# Patient Record
Sex: Female | Born: 1935 | Race: White | Hispanic: No | Smoking: Never smoker
Health system: Southern US, Community
[De-identification: ages and names within clinical notes are randomized; demographics above are authoritative.]

## PROBLEM LIST (undated history)

## (undated) DIAGNOSIS — J189 Pneumonia, unspecified organism: Secondary | ICD-10-CM

## (undated) DIAGNOSIS — G459 Transient cerebral ischemic attack, unspecified: Secondary | ICD-10-CM

## (undated) DIAGNOSIS — H9190 Unspecified hearing loss, unspecified ear: Secondary | ICD-10-CM

## (undated) DIAGNOSIS — S7290XA Unspecified fracture of unspecified femur, initial encounter for closed fracture: Secondary | ICD-10-CM

## (undated) DIAGNOSIS — C801 Malignant (primary) neoplasm, unspecified: Secondary | ICD-10-CM

## (undated) DIAGNOSIS — I10 Essential (primary) hypertension: Secondary | ICD-10-CM

## (undated) DIAGNOSIS — S329XXA Fracture of unspecified parts of lumbosacral spine and pelvis, initial encounter for closed fracture: Secondary | ICD-10-CM

## (undated) DIAGNOSIS — I639 Cerebral infarction, unspecified: Secondary | ICD-10-CM

## (undated) DIAGNOSIS — K219 Gastro-esophageal reflux disease without esophagitis: Secondary | ICD-10-CM

## (undated) DIAGNOSIS — Z974 Presence of external hearing-aid: Secondary | ICD-10-CM

## (undated) DIAGNOSIS — D649 Anemia, unspecified: Secondary | ICD-10-CM

## (undated) DIAGNOSIS — Z972 Presence of dental prosthetic device (complete) (partial): Secondary | ICD-10-CM

## (undated) DIAGNOSIS — H409 Unspecified glaucoma: Secondary | ICD-10-CM

## (undated) DIAGNOSIS — E039 Hypothyroidism, unspecified: Secondary | ICD-10-CM

## (undated) DIAGNOSIS — I7300 Raynaud's syndrome without gangrene: Secondary | ICD-10-CM

## (undated) DIAGNOSIS — R519 Headache, unspecified: Secondary | ICD-10-CM

## (undated) DIAGNOSIS — I719 Aortic aneurysm of unspecified site, without rupture: Secondary | ICD-10-CM

## (undated) DIAGNOSIS — R51 Headache: Secondary | ICD-10-CM

## (undated) DIAGNOSIS — Z973 Presence of spectacles and contact lenses: Secondary | ICD-10-CM

## (undated) HISTORY — PX: CATARACT EXTRACTION W/ INTRAOCULAR LENS  IMPLANT, BILATERAL: SHX1307

## (undated) HISTORY — PX: OTHER SURGICAL HISTORY: SHX169

## (undated) HISTORY — PX: MASTECTOMY: SHX3

---

## 1998-01-27 ENCOUNTER — Encounter: Payer: Self-pay | Admitting: Emergency Medicine

## 1998-01-27 ENCOUNTER — Ambulatory Visit (HOSPITAL_COMMUNITY): Admission: RE | Admit: 1998-01-27 | Discharge: 1998-01-27 | Payer: Self-pay | Admitting: Emergency Medicine

## 1999-09-03 ENCOUNTER — Encounter: Payer: Self-pay | Admitting: Emergency Medicine

## 1999-09-03 ENCOUNTER — Encounter: Admission: RE | Admit: 1999-09-03 | Discharge: 1999-09-03 | Payer: Self-pay | Admitting: Emergency Medicine

## 1999-11-27 ENCOUNTER — Other Ambulatory Visit: Admission: RE | Admit: 1999-11-27 | Discharge: 1999-11-27 | Payer: Self-pay | Admitting: Emergency Medicine

## 1999-11-27 ENCOUNTER — Encounter: Payer: Self-pay | Admitting: Emergency Medicine

## 1999-11-27 ENCOUNTER — Encounter: Admission: RE | Admit: 1999-11-27 | Discharge: 1999-11-27 | Payer: Self-pay | Admitting: Emergency Medicine

## 2000-09-04 ENCOUNTER — Encounter: Payer: Self-pay | Admitting: Emergency Medicine

## 2000-09-04 ENCOUNTER — Encounter: Admission: RE | Admit: 2000-09-04 | Discharge: 2000-09-04 | Payer: Self-pay | Admitting: Emergency Medicine

## 2001-05-13 ENCOUNTER — Encounter: Payer: Self-pay | Admitting: Emergency Medicine

## 2001-05-13 ENCOUNTER — Encounter: Admission: RE | Admit: 2001-05-13 | Discharge: 2001-05-13 | Payer: Self-pay | Admitting: Emergency Medicine

## 2001-06-15 ENCOUNTER — Encounter: Payer: Self-pay | Admitting: Emergency Medicine

## 2001-06-15 ENCOUNTER — Encounter: Admission: RE | Admit: 2001-06-15 | Discharge: 2001-06-15 | Payer: Self-pay | Admitting: Emergency Medicine

## 2001-06-18 ENCOUNTER — Encounter: Admission: RE | Admit: 2001-06-18 | Discharge: 2001-06-18 | Payer: Self-pay | Admitting: Emergency Medicine

## 2001-06-18 ENCOUNTER — Encounter: Payer: Self-pay | Admitting: Emergency Medicine

## 2001-08-18 ENCOUNTER — Encounter: Admission: RE | Admit: 2001-08-18 | Discharge: 2001-08-18 | Payer: Self-pay | Admitting: Emergency Medicine

## 2001-08-18 ENCOUNTER — Encounter: Payer: Self-pay | Admitting: Emergency Medicine

## 2002-01-14 ENCOUNTER — Ambulatory Visit (HOSPITAL_COMMUNITY): Admission: RE | Admit: 2002-01-14 | Discharge: 2002-01-14 | Payer: Self-pay | Admitting: Gastroenterology

## 2002-08-17 ENCOUNTER — Encounter: Payer: Self-pay | Admitting: Emergency Medicine

## 2002-08-17 ENCOUNTER — Encounter: Admission: RE | Admit: 2002-08-17 | Discharge: 2002-08-17 | Payer: Self-pay | Admitting: Emergency Medicine

## 2016-02-12 HISTORY — PX: MASTECTOMY: SHX3

## 2018-05-05 ENCOUNTER — Emergency Department (HOSPITAL_COMMUNITY): Payer: Medicare Other

## 2018-05-05 ENCOUNTER — Encounter (HOSPITAL_COMMUNITY): Payer: Self-pay | Admitting: Emergency Medicine

## 2018-05-05 ENCOUNTER — Inpatient Hospital Stay (HOSPITAL_COMMUNITY)
Admission: EM | Admit: 2018-05-05 | Discharge: 2018-05-08 | DRG: 481 | Disposition: A | Discharge status: Skilled Nursing Facility | Payer: Medicare Other | Attending: Internal Medicine | Admitting: Internal Medicine

## 2018-05-05 DIAGNOSIS — Z419 Encounter for procedure for purposes other than remedying health state, unspecified: Secondary | ICD-10-CM

## 2018-05-05 DIAGNOSIS — R269 Unspecified abnormalities of gait and mobility: Secondary | ICD-10-CM | POA: Diagnosis present

## 2018-05-05 DIAGNOSIS — Z6820 Body mass index (BMI) 20.0-20.9, adult: Secondary | ICD-10-CM

## 2018-05-05 DIAGNOSIS — S72332A Displaced oblique fracture of shaft of left femur, initial encounter for closed fracture: Secondary | ICD-10-CM

## 2018-05-05 DIAGNOSIS — W1830XA Fall on same level, unspecified, initial encounter: Secondary | ICD-10-CM | POA: Diagnosis present

## 2018-05-05 DIAGNOSIS — Z7901 Long term (current) use of anticoagulants: Secondary | ICD-10-CM

## 2018-05-05 DIAGNOSIS — I129 Hypertensive chronic kidney disease with stage 1 through stage 4 chronic kidney disease, or unspecified chronic kidney disease: Secondary | ICD-10-CM | POA: Diagnosis present

## 2018-05-05 DIAGNOSIS — I7121 Aneurysm of the ascending aorta, without rupture: Secondary | ICD-10-CM

## 2018-05-05 DIAGNOSIS — D62 Acute posthemorrhagic anemia: Secondary | ICD-10-CM | POA: Diagnosis not present

## 2018-05-05 DIAGNOSIS — Y9301 Activity, walking, marching and hiking: Secondary | ICD-10-CM | POA: Diagnosis present

## 2018-05-05 DIAGNOSIS — S72333A Displaced oblique fracture of shaft of unspecified femur, initial encounter for closed fracture: Secondary | ICD-10-CM | POA: Diagnosis present

## 2018-05-05 DIAGNOSIS — Z8249 Family history of ischemic heart disease and other diseases of the circulatory system: Secondary | ICD-10-CM

## 2018-05-05 DIAGNOSIS — W19XXXA Unspecified fall, initial encounter: Secondary | ICD-10-CM

## 2018-05-05 DIAGNOSIS — M25562 Pain in left knee: Secondary | ICD-10-CM | POA: Diagnosis not present

## 2018-05-05 DIAGNOSIS — N183 Chronic kidney disease, stage 3 unspecified: Secondary | ICD-10-CM

## 2018-05-05 DIAGNOSIS — Z823 Family history of stroke: Secondary | ICD-10-CM

## 2018-05-05 DIAGNOSIS — Z8673 Personal history of transient ischemic attack (TIA), and cerebral infarction without residual deficits: Secondary | ICD-10-CM

## 2018-05-05 DIAGNOSIS — T1490XA Injury, unspecified, initial encounter: Secondary | ICD-10-CM

## 2018-05-05 DIAGNOSIS — I712 Thoracic aortic aneurysm, without rupture: Secondary | ICD-10-CM

## 2018-05-05 DIAGNOSIS — I10 Essential (primary) hypertension: Secondary | ICD-10-CM

## 2018-05-05 DIAGNOSIS — E785 Hyperlipidemia, unspecified: Secondary | ICD-10-CM | POA: Diagnosis present

## 2018-05-05 HISTORY — DX: Essential (primary) hypertension: I10

## 2018-05-05 HISTORY — DX: Cerebral infarction, unspecified: I63.9

## 2018-05-05 HISTORY — DX: Raynaud's syndrome without gangrene: I73.00

## 2018-05-05 HISTORY — DX: Malignant (primary) neoplasm, unspecified: C80.1

## 2018-05-05 LAB — URINALYSIS, ROUTINE W REFLEX MICROSCOPIC
Bilirubin Urine: NEGATIVE
Glucose, UA: NEGATIVE mg/dL
Hgb urine dipstick: NEGATIVE
Ketones, ur: NEGATIVE mg/dL
Leukocytes,Ua: NEGATIVE
Nitrite: NEGATIVE
PH: 7 (ref 5.0–8.0)
Protein, ur: NEGATIVE mg/dL
Specific Gravity, Urine: 1.012 (ref 1.005–1.030)

## 2018-05-05 NOTE — ED Triage Notes (Signed)
Per EMS pt was found on the living room floor on her left side. Pt reports she fell. EMS reports seeing a folded rug corner near pt. EMS reports pt left leg was rotated out. Per EMS, Pt reported pain of 10 which decreased to 2 after leg was splinted. Pt refused pains meds. Pt was also started NaCl KVO en route. Pt denies LOC. Pt is on Xarelta, propanolol, and is allergic to penicillin. PT has hx of stroke, HTN, reynards, and a right mystectomy.

## 2018-05-05 NOTE — ED Provider Notes (Signed)
Medical screening examination/treatment/procedure(s) were conducted as a shared visit with non-physician practitioner(s) and myself.  I personally evaluated the patient during the encounter. Briefly, the patient is a 85 y.o. female is a 85 year old female who presents to the ED with left leg pain after being found on the floor for an extended period of time.  Patient with normal vitals.  No fever.  Patient found by neighbor on the floor.  She states that she fell landed on her left leg and was unable to get up.  EMS states that it appeared that she tripped over the corner of the rug.  She denies any chest pain, shortness of breath.  Did not have any dizzy this or syncope prior to the event.  She has good pulses throughout on exam.  She does have rotation of the left lower extremity.  Patient is on Xarelto.  Patient with overall normal neurological exam.  She is overall well-appearing.  Suspect leg fracture.  CT scan of the head and neck are unremarkable.  X-ray showed left femur fracture.  Lab work is pending.  EKG unremarkable.  Once lab work is back she will be admitted to hospitalist and will touch base with orthopedics about femur fracture that will likely need repair.  Patient does live by herself and is fully functional.  Hemodynamically stable throughout my care. Likely mechanical fall.  This chart was dictated using voice recognition software.  Despite best efforts to proofread,  errors can occur which can change the documentation meaning.     EKG Interpretation  Date/Time:  Tuesday May 05 2018 23:44:30 EDT Ventricular Rate:  64 PR Interval:    QRS Duration: 113 QT Interval:  434 QTC Calculation: 430 R Axis:   71 Text Interpretation:  Sinus rhythm Ventricular premature complex Short PR interval Biatrial enlargement Confirmed by Lennice Sites [phone]) on 05/05/2018 11:47:44 PM           Lennice Sites, DO 05/05/18 2350

## 2018-05-05 NOTE — ED Provider Notes (Addendum)
[facility] EMERGENCY DEPARTMENT Provider Note   CSN: [id number] Arrival date & time: 05/05/18  2134    History   Chief Complaint Chief Complaint  Patient presents with  . Leg Injury    HPI Laura Cabrera is a 85 y.o. female with PMHx previous stroke on Xarelto and HTN who presents to the ED via EMS for fall/left hip pain. Per nursing staff, EMS was called after neighbors noticed pt's garage door was opened for quite some time. They went and checked on her and found her laying on the floor; unknown down time. EMS reported pts leg was externally rotated and a splint was placed which decreased pts pain from a 10/10 to a 2/10. She refused pain medicine en route. Pt cannot recall how she fell but EMS reported a folded rug corner near pt. Pt reports she fell "when the sun was still up" and laid on her hardwood floor for quite some time. She is currently only complaining of left hip and left knee pain. No other complaints at this time.      Past Medical History:  Diagnosis Date  . Cancer [facility])    right breast cancer  . Hypertension   . Raynaud disease   . Stroke [facility])     Patient Active Problem List   Diagnosis Date Noted  . Closed displaced oblique fracture of shaft of femur ([place]) 05/06/2018  . Hypertension 05/06/2018  . History of stroke 05/06/2018  . Thoracic ascending aortic aneurysm ([place]) 05/06/2018  . CKD (chronic kidney disease), stage III ([place]) 05/06/2018    Past Surgical History:  Procedure Laterality Date  . INTRAMEDULLARY (IM) NAIL INTERTROCHANTERIC N/A 05/06/2018   Procedure: INTRAMEDULLARY (IM) NAIL INTERTROCHANTRIC;  Surgeon: Shona Needles, MD;  Location: [facility];  Service: Orthopedics;  Laterality: N/A;  . MASTECTOMY    . MASTECTOMY Right 2018     OB History   No obstetric history on file.      Home Medications    Prior to Admission medications   Medication Sig Start Date End Date Taking? Authorizing Provider  clopidogrel (PLAVIX) 75  MG tablet Take 75 mg by mouth daily. 04/20/18  Yes [provider]  latanoprost (XALATAN) 0.005 % ophthalmic solution Place 1 drop into both eyes at bedtime. 03/13/18  Yes [provider]  propranolol (INDERAL) 40 MG tablet Take 40 mg by mouth daily. 12/10/17  Yes [provider]  oxyCODONE (OXY IR/ROXICODONE) 5 MG immediate release tablet Take 1 tablet (5 mg total) by mouth every 6 (six) hours as needed for moderate pain or breakthrough pain. 05/08/18   Arrien, Jimmy Picket, MD    Family History Family History  Problem Relation Age of Onset  . Stroke Mother   . Hypertension Father   . Coronary artery disease Father     Social History Social History   Tobacco Use  . Smoking status: Never Smoker  . Smokeless tobacco: Never Used  Substance Use Topics  . Alcohol use: Never    Frequency: Never  . Drug use: Never     Allergies   Penicillins; Sulfamethoxazole; and Penicillin g   Review of Systems Review of Systems  Constitutional: Negative for fever.  HENT: Negative for congestion.   Eyes: Negative for redness.  Respiratory: Negative for cough.   Cardiovascular: Negative for chest pain.  Gastrointestinal: Negative for abdominal pain.  Musculoskeletal: Positive for arthralgias and joint swelling. Negative for neck pain.  Skin: Negative for wound.  Neurological: Negative for  headaches.  Hematological: Bruises/bleeds easily.     Physical Exam Updated Vital Signs BP (!) 156/116   Pulse (!) 54   Temp 98.3 F (36.8 C)   Resp 20   SpO2 99%   Physical Exam Vitals signs and nursing note reviewed.  Constitutional:      Appearance: Normal appearance. She is not ill-appearing.  HENT:     Head: Normocephalic and atraumatic.  Eyes:     Conjunctiva/sclera: Conjunctivae normal.  Neck:     Musculoskeletal: Neck supple.  Cardiovascular:     Rate and Rhythm: Normal rate and regular rhythm.     Pulses: Normal pulses.     Heart sounds: No murmur.   Pulmonary:     Effort: Pulmonary effort is normal.     Breath sounds: Normal breath sounds. No wheezing, rhonchi or rales.  Abdominal:     Palpations: Abdomen is soft.     Tenderness: There is no abdominal tenderness.  Musculoskeletal:     Comments: Left leg splinted; mild swelling to anterior thigh; tenderness to palpation of left thigh anteriorly and left knee. DP pulse 2+ on left. Sensation intact. No TTP of left ankle. Able to wiggle toes.   No C, T, or L midline spine TTP.   Skin:    General: Skin is warm and dry.  Neurological:     Mental Status: She is alert.      ED Treatments / Results  Labs (all labs ordered are listed, but only abnormal results are displayed) Labs Reviewed  CBC WITH DIFFERENTIAL/PLATELET - Abnormal; Notable for the following components:      Result Value   WBC 12.2 (*)    RBC 3.20 (*)    Hemoglobin 9.9 (*)    HCT 31.9 (*)    Platelets 130 (*)    Neutro Abs 10.4 (*)    Abs Immature Granulocytes 0.08 (*)    All other components within normal limits  URINALYSIS, ROUTINE W REFLEX MICROSCOPIC - Abnormal; Notable for the following components:   Color, Urine STRAW (*)    All other components within normal limits  COMPREHENSIVE METABOLIC PANEL - Abnormal; Notable for the following components:   Glucose, Bld 139 (*)    Creatinine, Ser 1.22 (*)    Total Protein 6.3 (*)    Albumin 3.3 (*)    GFR calc non Af Amer 39 (*)    GFR calc Af Amer 45 (*)    All other components within normal limits  BASIC METABOLIC PANEL - Abnormal; Notable for the following components:   Glucose, Bld 159 (*)    Creatinine, Ser 1.20 (*)    Calcium 8.8 (*)    GFR calc non Af Amer 39 (*)    GFR calc Af Amer 46 (*)    All other components within normal limits  CBC - Abnormal; Notable for the following components:   RBC 2.98 (*)    Hemoglobin 9.6 (*)    HCT 29.1 (*)    Platelets 117 (*)    All other components within normal limits  CBC WITH DIFFERENTIAL/PLATELET -  Abnormal; Notable for the following components:   RBC 2.52 (*)    Hemoglobin 7.7 (*)    HCT 24.2 (*)    Platelets 101 (*)    All other components within normal limits  BASIC METABOLIC PANEL - Abnormal; Notable for the following components:   Sodium 133 (*)    Glucose, Bld 149 (*)    Creatinine, Ser 1.22 (*)  Calcium 8.3 (*)    GFR calc non Af Amer 39 (*)    GFR calc Af Amer 45 (*)    All other components within normal limits  CBC - Abnormal; Notable for the following components:   RBC 2.20 (*)    Hemoglobin 7.0 (*)    HCT 21.5 (*)    Platelets 96 (*)    All other components within normal limits  MRSA PCR SCREENING  SURGICAL PCR SCREEN  CK  TYPE AND SCREEN  ABO/RH    EKG EKG Interpretation  Date/Time:  Tuesday May 05 2018 23:44:30 EDT Ventricular Rate:  64 PR Interval:    QRS Duration: 113 QT Interval:  434 QTC Calculation: 430 R Axis:   71 Text Interpretation:  Sinus rhythm Ventricular premature complex Short PR interval Biatrial enlargement Confirmed by Lennice Sites [phone]) on 05/05/2018 11:47:44 PM Also confirmed by Lennice Sites [phone]), editor Philomena Doheny [phone])  on 05/06/2018 7:14:55 AM   Radiology No results found.  Procedures Procedures (including critical care time)  Medications Ordered in ED Medications  ceFAZolin (ANCEF) 2-4 GM/100ML-% IVPB (has no administration in time range)  fentaNYL (SUBLIMAZE) injection 50 mcg (50 mcg Intravenous Given 05/06/18 0034)  ceFAZolin (ANCEF) IVPB 2g/100 mL premix (2 g Intravenous New Bag/Given 05/07/18 0534)     Initial Impression / Assessment and Plan / ED Course  I have reviewed the triage vital signs and the nursing notes.  Pertinent labs & imaging results that were available during my care of the patient were reviewed by me and considered in my medical decision making (see chart for details).  Clinical Course as of May 07 2325  Tue May 05, 2018  2200 Patient seen by myself with orienting PA Eustaquio Maize.  Patient with fall, down for unknown amount of time and found by neighbor.  Left lower extremity splinted on arrival, patient found to have internal rotation and shortening with tenderness over the left hip and proximal femur concerning for possible hip fracture.  No other obvious injuries on exam.  Will check basic labs, CK, urinalysis.  Pelvis and femur films, chest x-ray and CT head and C-spine.  Patient is currently on Xarelto.  X-ray shows proximal femur fracture, imaging is otherwise unremarkable.  Patient is neurovascularly intact blood pressure remains stable and does not suggest significant blood loss from femur fracture.  Case discussed with Dr. Marcelino Scot with orthopedics who recommends Buck's traction and will see and evaluate the patient.  Case discussed with Dr. Posey Pronto with [facility] hospitalist who will see and admit the patient.   [KF]    Clinical Course User Index [KF] Jacqlyn Larsen, PA-C   Pt presents for ground level fall; unknown cause and unknown down time. Currently complaining of left leg pain; splinted en route. EMS reported leg was externally rotated. Pt is on Xarelto and unable to say if she hit her head. Will get x rays of left hip, femur, and knee. CT Head ordered as well; pt unable to say if she hit her head. EKG ordered as pt unable to state what caused her to fall. CK ordered to rule out rhabdo given unknown down time. Baseline labs ordered including CBC, CMP, U/A.   12:15 AM R femur x ray shows displaced angulated proximal femur fracture. No other findings on CT Head, CT C spine, or remainder of x rays. U/A negative. EKG negative. Mildly elevated creatinine 1.22; unknown baseline. Benedetto Goad, PA-C discussed case with ortho Dr. Marcelino Scot who is in  agreement to see pt in the morning. Order placed to have pt put into [facility] traction. Will consult hospitalist once remainder of labs return. 50 mg Fentanyl ordered for pt prior to traction placed.   12:40 AM CBC with mildly  elevated leukocytosis of 12.2; likely increased due to pain. H&H stable at this time; do not have a previous value to compare it to. CK not elevated; no concern for rhabdo at this time. Benedetto Goad, PA-C discussed case with hospitalist Dr. Posey Pronto who is in agreement to admit patient.        Final Clinical Impressions(s) / ED Diagnoses   Final diagnoses:  Closed displaced oblique fracture of shaft of left femur, initial encounter ([place])  Fall, initial encounter    ED Discharge Orders         Ordered    oxyCODONE (OXY IR/ROXICODONE) 5 MG immediate release tablet  Every 6 hours PRN     05/08/18 1207    Increase activity slowly     05/08/18 1207    Diet - low sodium heart healthy     05/08/18 1207    Discharge instructions    Comments:  Please follow with primary care in 7 days.   05/08/18 [place], [place], PA-C 05/06/18 0054    Eustaquio Maize, PA-C 05/08/18 2327    Lennice Sites, DO 05/09/18 1112

## 2018-05-06 ENCOUNTER — Other Ambulatory Visit: Payer: Self-pay

## 2018-05-06 ENCOUNTER — Inpatient Hospital Stay (HOSPITAL_COMMUNITY): Payer: Medicare Other

## 2018-05-06 ENCOUNTER — Encounter (HOSPITAL_COMMUNITY)
Admission: EM | Disposition: A | Discharge status: Skilled Nursing Facility | Payer: Self-pay | Source: Home / Self Care | Attending: Internal Medicine

## 2018-05-06 ENCOUNTER — Inpatient Hospital Stay (HOSPITAL_COMMUNITY): Payer: Medicare Other | Admitting: Certified Registered"

## 2018-05-06 ENCOUNTER — Encounter (HOSPITAL_COMMUNITY): Payer: Self-pay | Admitting: Internal Medicine

## 2018-05-06 DIAGNOSIS — I712 Thoracic aortic aneurysm, without rupture: Secondary | ICD-10-CM | POA: Diagnosis present

## 2018-05-06 DIAGNOSIS — R269 Unspecified abnormalities of gait and mobility: Secondary | ICD-10-CM | POA: Diagnosis present

## 2018-05-06 DIAGNOSIS — W1830XA Fall on same level, unspecified, initial encounter: Secondary | ICD-10-CM | POA: Diagnosis present

## 2018-05-06 DIAGNOSIS — I129 Hypertensive chronic kidney disease with stage 1 through stage 4 chronic kidney disease, or unspecified chronic kidney disease: Secondary | ICD-10-CM | POA: Diagnosis present

## 2018-05-06 DIAGNOSIS — Z8673 Personal history of transient ischemic attack (TIA), and cerebral infarction without residual deficits: Secondary | ICD-10-CM

## 2018-05-06 DIAGNOSIS — Z823 Family history of stroke: Secondary | ICD-10-CM | POA: Diagnosis not present

## 2018-05-06 DIAGNOSIS — E785 Hyperlipidemia, unspecified: Secondary | ICD-10-CM | POA: Diagnosis present

## 2018-05-06 DIAGNOSIS — D62 Acute posthemorrhagic anemia: Secondary | ICD-10-CM | POA: Diagnosis not present

## 2018-05-06 DIAGNOSIS — I10 Essential (primary) hypertension: Secondary | ICD-10-CM

## 2018-05-06 DIAGNOSIS — N183 Chronic kidney disease, stage 3 unspecified: Secondary | ICD-10-CM

## 2018-05-06 DIAGNOSIS — Z7901 Long term (current) use of anticoagulants: Secondary | ICD-10-CM | POA: Diagnosis not present

## 2018-05-06 DIAGNOSIS — S72333A Displaced oblique fracture of shaft of unspecified femur, initial encounter for closed fracture: Secondary | ICD-10-CM | POA: Diagnosis present

## 2018-05-06 DIAGNOSIS — M25562 Pain in left knee: Secondary | ICD-10-CM | POA: Diagnosis present

## 2018-05-06 DIAGNOSIS — Z6820 Body mass index (BMI) 20.0-20.9, adult: Secondary | ICD-10-CM | POA: Diagnosis not present

## 2018-05-06 DIAGNOSIS — I7121 Aneurysm of the ascending aorta, without rupture: Secondary | ICD-10-CM

## 2018-05-06 DIAGNOSIS — Z8249 Family history of ischemic heart disease and other diseases of the circulatory system: Secondary | ICD-10-CM | POA: Diagnosis not present

## 2018-05-06 DIAGNOSIS — S72332A Displaced oblique fracture of shaft of left femur, initial encounter for closed fracture: Principal | ICD-10-CM

## 2018-05-06 DIAGNOSIS — Y9301 Activity, walking, marching and hiking: Secondary | ICD-10-CM | POA: Diagnosis present

## 2018-05-06 DIAGNOSIS — D649 Anemia, unspecified: Secondary | ICD-10-CM | POA: Diagnosis not present

## 2018-05-06 HISTORY — PX: INTRAMEDULLARY (IM) NAIL INTERTROCHANTERIC: SHX5875

## 2018-05-06 LAB — BASIC METABOLIC PANEL
Anion gap: 8 (ref 5–15)
BUN: 20 mg/dL (ref 8–23)
CO2: 24 mmol/L (ref 22–32)
Calcium: 8.8 mg/dL — ABNORMAL LOW (ref 8.9–10.3)
Chloride: 103 mmol/L (ref 98–111)
Creatinine, Ser: 1.2 mg/dL — ABNORMAL HIGH (ref 0.44–1.00)
GFR calc Af Amer: 46 mL/min — ABNORMAL LOW (ref >60)
GFR calc non Af Amer: 39 mL/min — ABNORMAL LOW (ref >60)
GLUCOSE: 159 mg/dL — AB (ref 70–99)
Potassium: 4 mmol/L (ref 3.5–5.1)
Sodium: 135 mmol/L (ref 135–145)

## 2018-05-06 LAB — COMPREHENSIVE METABOLIC PANEL
ALBUMIN: 3.3 g/dL — AB (ref 3.5–5.0)
ALT: 13 U/L (ref 0–44)
ANION GAP: 11 (ref 5–15)
AST: 40 U/L (ref 15–41)
Alkaline Phosphatase: 65 U/L (ref 38–126)
BUN: 20 mg/dL (ref 8–23)
CO2: 24 mmol/L (ref 22–32)
Calcium: 9.1 mg/dL (ref 8.9–10.3)
Chloride: 101 mmol/L (ref 98–111)
Creatinine, Ser: 1.22 mg/dL — ABNORMAL HIGH (ref 0.44–1.00)
GFR calc Af Amer: 45 mL/min — ABNORMAL LOW (ref >60)
GFR calc non Af Amer: 39 mL/min — ABNORMAL LOW (ref >60)
Glucose, Bld: 139 mg/dL — ABNORMAL HIGH (ref 70–99)
Potassium: 4.3 mmol/L (ref 3.5–5.1)
Sodium: 136 mmol/L (ref 135–145)
Total Bilirubin: 0.9 mg/dL (ref 0.3–1.2)
Total Protein: 6.3 g/dL — ABNORMAL LOW (ref 6.5–8.1)

## 2018-05-06 LAB — CBC WITH DIFFERENTIAL/PLATELET
Abs Immature Granulocytes: 0.08 10*3/uL — ABNORMAL HIGH (ref 0.00–0.07)
Basophils Absolute: 0 10*3/uL (ref 0.0–0.1)
Basophils Relative: 0 %
Eosinophils Absolute: 0.1 10*3/uL (ref 0.0–0.5)
Eosinophils Relative: 1 %
HEMATOCRIT: 31.9 % — AB (ref 36.0–46.0)
Hemoglobin: 9.9 g/dL — ABNORMAL LOW (ref 12.0–15.0)
IMMATURE GRANULOCYTES: 1 %
Lymphocytes Relative: 8 %
Lymphs Abs: 0.9 10*3/uL (ref 0.7–4.0)
MCH: 30.9 pg (ref 26.0–34.0)
MCHC: 31 g/dL (ref 30.0–36.0)
MCV: 99.7 fL (ref 80.0–100.0)
Monocytes Absolute: 0.6 10*3/uL (ref 0.1–1.0)
Monocytes Relative: 5 %
Neutro Abs: 10.4 10*3/uL — ABNORMAL HIGH (ref 1.7–7.7)
Neutrophils Relative %: 85 %
Platelets: 130 10*3/uL — ABNORMAL LOW (ref 150–400)
RBC: 3.2 MIL/uL — ABNORMAL LOW (ref 3.87–5.11)
RDW: 13.3 % (ref 11.5–15.5)
WBC: 12.2 10*3/uL — ABNORMAL HIGH (ref 4.0–10.5)
nRBC: 0 % (ref 0.0–0.2)

## 2018-05-06 LAB — CBC
HCT: 29.1 % — ABNORMAL LOW (ref 36.0–46.0)
Hemoglobin: 9.6 g/dL — ABNORMAL LOW (ref 12.0–15.0)
MCH: 32.2 pg (ref 26.0–34.0)
MCHC: 33 g/dL (ref 30.0–36.0)
MCV: 97.7 fL (ref 80.0–100.0)
Platelets: 117 10*3/uL — ABNORMAL LOW (ref 150–400)
RBC: 2.98 MIL/uL — ABNORMAL LOW (ref 3.87–5.11)
RDW: 13.4 % (ref 11.5–15.5)
WBC: 9.9 10*3/uL (ref 4.0–10.5)
nRBC: 0 % (ref 0.0–0.2)

## 2018-05-06 LAB — TYPE AND SCREEN
ABO/RH(D): A POS
ANTIBODY SCREEN: NEGATIVE

## 2018-05-06 LAB — MRSA PCR SCREENING: MRSA by PCR: NEGATIVE

## 2018-05-06 LAB — ABO/RH: ABO/RH(D): A POS

## 2018-05-06 LAB — CK: Total CK: 168 U/L (ref 38–234)

## 2018-05-06 SURGERY — FIXATION, FRACTURE, INTERTROCHANTERIC, WITH INTRAMEDULLARY ROD
Anesthesia: General

## 2018-05-06 MED ORDER — FENTANYL CITRATE (PF) 100 MCG/2ML IJ SOLN
50.0000 ug | Freq: Once | INTRAMUSCULAR | Status: AC
  Administered 2018-05-06: 50 ug via INTRAVENOUS
  Filled 2018-05-06: qty 2

## 2018-05-06 MED ORDER — PHENYLEPHRINE 40 MCG/ML (10ML) SYRINGE FOR IV PUSH (FOR BLOOD PRESSURE SUPPORT)
PREFILLED_SYRINGE | INTRAVENOUS | Status: AC
  Filled 2018-05-06: qty 20

## 2018-05-06 MED ORDER — SODIUM CHLORIDE 0.9% FLUSH
3.0000 mL | Freq: Two times a day (BID) | INTRAVENOUS | Status: DC
  Administered 2018-05-07 – 2018-05-08 (×2): 3 mL via INTRAVENOUS

## 2018-05-06 MED ORDER — GLYCOPYRROLATE PF 0.2 MG/ML IJ SOSY
PREFILLED_SYRINGE | INTRAMUSCULAR | Status: DC | PRN
  Administered 2018-05-06: .1 mg via INTRAVENOUS

## 2018-05-06 MED ORDER — OXYCODONE HCL 5 MG/5ML PO SOLN: 5.0000 mg | Freq: Once | ORAL | Status: DC | PRN

## 2018-05-06 MED ORDER — PROPOFOL 10 MG/ML IV BOLUS
INTRAVENOUS | Status: AC
  Filled 2018-05-06: qty 20

## 2018-05-06 MED ORDER — SENNOSIDES-DOCUSATE SODIUM 8.6-50 MG PO TABS: 1.0000 | ORAL_TABLET | Freq: Every evening | ORAL | Status: DC | PRN

## 2018-05-06 MED ORDER — OXYCODONE HCL 5 MG PO TABS: 5.0000 mg | ORAL_TABLET | ORAL | Status: DC | PRN

## 2018-05-06 MED ORDER — ACETAMINOPHEN 325 MG PO TABS
650.0000 mg | ORAL_TABLET | Freq: Four times a day (QID) | ORAL | Status: DC
  Administered 2018-05-06 – 2018-05-08 (×5): 650 mg via ORAL
  Filled 2018-05-06 (×7): qty 2

## 2018-05-06 MED ORDER — ACETAMINOPHEN 10 MG/ML IV SOLN: 1000.0000 mg | Freq: Once | INTRAVENOUS | Status: DC | PRN

## 2018-05-06 MED ORDER — SODIUM CHLORIDE 0.9 % IV SOLN
INTRAVENOUS | Status: DC
  Administered 2018-05-06: 02:00:00 via INTRAVENOUS

## 2018-05-06 MED ORDER — OXYCODONE HCL 5 MG PO TABS: 5.0000 mg | ORAL_TABLET | Freq: Once | ORAL | Status: DC | PRN

## 2018-05-06 MED ORDER — SUCCINYLCHOLINE CHLORIDE 200 MG/10ML IV SOSY
PREFILLED_SYRINGE | INTRAVENOUS | Status: AC
  Filled 2018-05-06: qty 10

## 2018-05-06 MED ORDER — ACETAMINOPHEN 325 MG PO TABS: 650.0000 mg | ORAL_TABLET | Freq: Four times a day (QID) | ORAL | Status: DC | PRN

## 2018-05-06 MED ORDER — CEFAZOLIN SODIUM-DEXTROSE 2-3 GM-%(50ML) IV SOLR
INTRAVENOUS | Status: DC | PRN
  Administered 2018-05-06: 2 g via INTRAVENOUS

## 2018-05-06 MED ORDER — LIDOCAINE 2% (20 MG/ML) 5 ML SYRINGE
INTRAMUSCULAR | Status: AC
  Filled 2018-05-06: qty 5

## 2018-05-06 MED ORDER — PROPRANOLOL HCL 20 MG PO TABS
40.0000 mg | ORAL_TABLET | Freq: Every day | ORAL | Status: DC
  Administered 2018-05-06 – 2018-05-08 (×3): 40 mg via ORAL
  Filled 2018-05-06 (×2): qty 2
  Filled 2018-05-06: qty 1
  Filled 2018-05-06: qty 2

## 2018-05-06 MED ORDER — ACETAMINOPHEN 650 MG RE SUPP: 650.0000 mg | Freq: Four times a day (QID) | RECTAL | Status: DC | PRN

## 2018-05-06 MED ORDER — FENTANYL CITRATE (PF) 100 MCG/2ML IJ SOLN
INTRAMUSCULAR | Status: AC
  Filled 2018-05-06: qty 2

## 2018-05-06 MED ORDER — SUCCINYLCHOLINE CHLORIDE 200 MG/10ML IV SOSY
PREFILLED_SYRINGE | INTRAVENOUS | Status: DC | PRN
  Administered 2018-05-06: 50 mg via INTRAVENOUS

## 2018-05-06 MED ORDER — ACETAMINOPHEN 500 MG PO TABS: 1000.0000 mg | ORAL_TABLET | Freq: Once | ORAL | Status: DC | PRN

## 2018-05-06 MED ORDER — EPHEDRINE 5 MG/ML INJ
INTRAVENOUS | Status: AC
  Filled 2018-05-06: qty 10

## 2018-05-06 MED ORDER — METHOCARBAMOL 500 MG PO TABS
500.0000 mg | ORAL_TABLET | Freq: Four times a day (QID) | ORAL | Status: DC | PRN
  Filled 2018-05-06: qty 1

## 2018-05-06 MED ORDER — FENTANYL CITRATE (PF) 250 MCG/5ML IJ SOLN
INTRAMUSCULAR | Status: AC
  Filled 2018-05-06: qty 5

## 2018-05-06 MED ORDER — LACTATED RINGERS IV SOLN
INTRAVENOUS | Status: DC
  Administered 2018-05-06: 12:00:00 via INTRAVENOUS

## 2018-05-06 MED ORDER — SUGAMMADEX SODIUM 200 MG/2ML IV SOLN
INTRAVENOUS | Status: DC | PRN
  Administered 2018-05-06: 100 mg via INTRAVENOUS

## 2018-05-06 MED ORDER — ROCURONIUM BROMIDE 50 MG/5ML IV SOSY
PREFILLED_SYRINGE | INTRAVENOUS | Status: DC | PRN
  Administered 2018-05-06: 30 mg via INTRAVENOUS

## 2018-05-06 MED ORDER — PROPOFOL 10 MG/ML IV BOLUS
INTRAVENOUS | Status: DC | PRN
  Administered 2018-05-06: 10 mg via INTRAVENOUS
  Administered 2018-05-06: 70 mg via INTRAVENOUS

## 2018-05-06 MED ORDER — EPHEDRINE SULFATE-NACL 50-0.9 MG/10ML-% IV SOSY
PREFILLED_SYRINGE | INTRAVENOUS | Status: DC | PRN
  Administered 2018-05-06: 10 mg via INTRAVENOUS

## 2018-05-06 MED ORDER — CEFAZOLIN SODIUM-DEXTROSE 2-4 GM/100ML-% IV SOLN
2.0000 g | Freq: Three times a day (TID) | INTRAVENOUS | Status: AC
  Administered 2018-05-06 – 2018-05-07 (×3): 2 g via INTRAVENOUS
  Filled 2018-05-06 (×3): qty 100

## 2018-05-06 MED ORDER — PHENYLEPHRINE 40 MCG/ML (10ML) SYRINGE FOR IV PUSH (FOR BLOOD PRESSURE SUPPORT)
PREFILLED_SYRINGE | INTRAVENOUS | Status: DC | PRN
  Administered 2018-05-06: 80 ug via INTRAVENOUS
  Administered 2018-05-06: 40 ug via INTRAVENOUS
  Administered 2018-05-06: 120 ug via INTRAVENOUS
  Administered 2018-05-06 (×2): 80 ug via INTRAVENOUS

## 2018-05-06 MED ORDER — ONDANSETRON HCL 4 MG/2ML IJ SOLN: 4.0000 mg | Freq: Four times a day (QID) | INTRAMUSCULAR | Status: DC | PRN

## 2018-05-06 MED ORDER — FENTANYL CITRATE (PF) 100 MCG/2ML IJ SOLN
25.0000 ug | INTRAMUSCULAR | Status: DC | PRN
  Administered 2018-05-06: 12.5 ug via INTRAVENOUS

## 2018-05-06 MED ORDER — HYDROMORPHONE HCL 1 MG/ML IJ SOLN: 0.5000 mg | INTRAMUSCULAR | Status: DC | PRN

## 2018-05-06 MED ORDER — DEXAMETHASONE SODIUM PHOSPHATE 10 MG/ML IJ SOLN
INTRAMUSCULAR | Status: DC | PRN
  Administered 2018-05-06: 5 mg via INTRAVENOUS

## 2018-05-06 MED ORDER — ACETAMINOPHEN 160 MG/5ML PO SOLN: 1000.0000 mg | Freq: Once | ORAL | Status: DC | PRN

## 2018-05-06 MED ORDER — FENTANYL CITRATE (PF) 100 MCG/2ML IJ SOLN
INTRAMUSCULAR | Status: DC | PRN
  Administered 2018-05-06: 50 ug via INTRAVENOUS

## 2018-05-06 MED ORDER — LIDOCAINE 2% (20 MG/ML) 5 ML SYRINGE
INTRAMUSCULAR | Status: DC | PRN
  Administered 2018-05-06: 60 mg via INTRAVENOUS

## 2018-05-06 MED ORDER — CEFAZOLIN SODIUM-DEXTROSE 2-4 GM/100ML-% IV SOLN
INTRAVENOUS | Status: AC
  Filled 2018-05-06: qty 100

## 2018-05-06 MED ORDER — ROCURONIUM BROMIDE 50 MG/5ML IV SOSY
PREFILLED_SYRINGE | INTRAVENOUS | Status: AC
  Filled 2018-05-06: qty 5

## 2018-05-06 MED ORDER — DEXAMETHASONE SODIUM PHOSPHATE 10 MG/ML IJ SOLN
INTRAMUSCULAR | Status: AC
  Filled 2018-05-06: qty 1

## 2018-05-06 MED ORDER — VANCOMYCIN HCL 1000 MG IV SOLR
INTRAVENOUS | Status: AC
  Filled 2018-05-06: qty 1000

## 2018-05-06 MED ORDER — ONDANSETRON HCL 4 MG PO TABS: 4.0000 mg | ORAL_TABLET | Freq: Four times a day (QID) | ORAL | Status: DC | PRN

## 2018-05-06 MED ORDER — POLYVINYL ALCOHOL 1.4 % OP SOLN
1.0000 [drp] | OPHTHALMIC | Status: DC | PRN
  Administered 2018-05-06: 1 [drp] via OPHTHALMIC
  Filled 2018-05-06: qty 15

## 2018-05-06 MED ORDER — 0.9 % SODIUM CHLORIDE (POUR BTL) OPTIME
TOPICAL | Status: DC | PRN
  Administered 2018-05-06: 1000 mL

## 2018-05-06 MED ORDER — VANCOMYCIN HCL 1000 MG IV SOLR
INTRAVENOUS | Status: DC | PRN
  Administered 2018-05-06: 1000 mg via TOPICAL

## 2018-05-06 MED ORDER — ONDANSETRON HCL 4 MG/2ML IJ SOLN
INTRAMUSCULAR | Status: DC | PRN
  Administered 2018-05-06: 4 mg via INTRAVENOUS

## 2018-05-06 MED ORDER — LATANOPROST 0.005 % OP SOLN
1.0000 [drp] | Freq: Every day | OPHTHALMIC | Status: DC
  Administered 2018-05-06 – 2018-05-07 (×3): 1 [drp] via OPHTHALMIC
  Filled 2018-05-06: qty 2.5

## 2018-05-06 MED ORDER — PHENYLEPHRINE HCL 10 MG/ML IJ SOLN: INTRAMUSCULAR | Status: DC | PRN

## 2018-05-06 MED ORDER — CLOPIDOGREL BISULFATE 75 MG PO TABS
75.0000 mg | ORAL_TABLET | Freq: Every day | ORAL | Status: DC
  Administered 2018-05-07 – 2018-05-08 (×2): 75 mg via ORAL
  Filled 2018-05-06 (×2): qty 1

## 2018-05-06 MED ORDER — GLYCOPYRROLATE PF 0.2 MG/ML IJ SOSY
PREFILLED_SYRINGE | INTRAMUSCULAR | Status: AC
  Filled 2018-05-06: qty 1

## 2018-05-06 MED ORDER — ONDANSETRON HCL 4 MG/2ML IJ SOLN
INTRAMUSCULAR | Status: AC
  Filled 2018-05-06: qty 2

## 2018-05-06 SURGICAL SUPPLY — 52 items
BIT DRILL SHORT 4.2 (BIT) ×2 IMPLANT
BNDG ELASTIC 6X10 VLCR STRL LF (GAUZE/BANDAGES/DRESSINGS) ×3 IMPLANT
BRUSH SCRUB SURG 4.25 DISP (MISCELLANEOUS) ×6 IMPLANT
CHLORAPREP W/TINT 26ML (MISCELLANEOUS) ×3 IMPLANT
COVER PERINEAL POST (MISCELLANEOUS) ×3 IMPLANT
COVER SURGICAL LIGHT HANDLE (MISCELLANEOUS) ×3 IMPLANT
COVER WAND RF STERILE (DRAPES) ×3 IMPLANT
DERMABOND ADHESIVE PROPEN (GAUZE/BANDAGES/DRESSINGS) ×2
DERMABOND ADVANCED (GAUZE/BANDAGES/DRESSINGS) ×2
DERMABOND ADVANCED .7 DNX12 (GAUZE/BANDAGES/DRESSINGS) ×1 IMPLANT
DERMABOND ADVANCED .7 DNX6 (GAUZE/BANDAGES/DRESSINGS) ×1 IMPLANT
DRAPE HALF SHEET 40X57 (DRAPES) ×6 IMPLANT
DRAPE IMP U-DRAPE 54X76 (DRAPES) ×9 IMPLANT
DRAPE INCISE IOBAN 66X45 STRL (DRAPES) ×3 IMPLANT
DRAPE STERI IOBAN 125X83 (DRAPES) ×3 IMPLANT
DRAPE SURG 17X23 STRL (DRAPES) ×6 IMPLANT
DRAPE U-SHAPE 47X51 STRL (DRAPES) ×3 IMPLANT
DRILL BIT SHORT 4.2 (BIT) ×4
DRSG MEPILEX BORDER 4X4 (GAUZE/BANDAGES/DRESSINGS) ×3 IMPLANT
DRSG MEPILEX BORDER 4X8 (GAUZE/BANDAGES/DRESSINGS) ×3 IMPLANT
ELECT REM PT RETURN 9FT ADLT (ELECTROSURGICAL) ×3
ELECTRODE REM PT RTRN 9FT ADLT (ELECTROSURGICAL) ×1 IMPLANT
GLOVE BIO SURGEON STRL SZ 6.5 (GLOVE) ×6 IMPLANT
GLOVE BIO SURGEON STRL SZ7.5 (GLOVE) ×12 IMPLANT
GLOVE BIO SURGEONS STRL SZ 6.5 (GLOVE) ×3
GLOVE BIOGEL PI IND STRL 6.5 (GLOVE) ×1 IMPLANT
GLOVE BIOGEL PI IND STRL 7.5 (GLOVE) ×1 IMPLANT
GLOVE BIOGEL PI INDICATOR 6.5 (GLOVE) ×2
GLOVE BIOGEL PI INDICATOR 7.5 (GLOVE) ×2
GOWN STRL REUS W/ TWL LRG LVL3 (GOWN DISPOSABLE) ×1 IMPLANT
GOWN STRL REUS W/TWL LRG LVL3 (GOWN DISPOSABLE) ×2
GUIDEWIRE 3.2X400 (WIRE) ×6 IMPLANT
KIT BASIN OR (CUSTOM PROCEDURE TRAY) ×3 IMPLANT
KIT TURNOVER KIT B (KITS) ×3 IMPLANT
LINER BOOT UNIVERSAL DISP (MISCELLANEOUS) ×3 IMPLANT
MANIFOLD NEPTUNE II (INSTRUMENTS) ×3 IMPLANT
NAIL CANN FRN 10X380 LT (Nail) ×3 IMPLANT
NS IRRIG 1000ML POUR BTL (IV SOLUTION) ×3 IMPLANT
PACK GENERAL/GYN (CUSTOM PROCEDURE TRAY) ×3 IMPLANT
PAD ARMBOARD 7.5X6 YLW CONV (MISCELLANEOUS) ×6 IMPLANT
REAMER ROD DEEP FLUTE 2.5X950 (INSTRUMENTS) ×3 IMPLANT
SCREW 6.5MM W/T25 STARDRIVE (Screw) ×3 IMPLANT
SCREW LOCKING 5.0MMX44MM (Screw) ×3 IMPLANT
SCREW LOCKING 5.0X56MM (Screw) ×3 IMPLANT
SCREW RECON 6.5X100MM (Screw) ×3 IMPLANT
SUT MNCRL AB 3-0 PS2 18 (SUTURE) ×3 IMPLANT
SUT VIC AB 0 CT1 27 (SUTURE)
SUT VIC AB 0 CT1 27XBRD ANBCTR (SUTURE) IMPLANT
SUT VIC AB 2-0 CT1 27 (SUTURE) ×4
SUT VIC AB 2-0 CT1 TAPERPNT 27 (SUTURE) ×2 IMPLANT
TOWEL OR 17X26 10 PK STRL BLUE (TOWEL DISPOSABLE) ×6 IMPLANT
WATER STERILE IRR 1000ML POUR (IV SOLUTION) ×3 IMPLANT

## 2018-05-06 NOTE — Plan of Care (Signed)

## 2018-05-06 NOTE — ED Notes (Signed)
Pt's daughter Jeani Hawking and Ulis Rias would like to be contacted with any updates at phone numbers in chart. Spoke with daughter Jeani Hawking to update her on room pt would be transferred to.

## 2018-05-06 NOTE — Transfer of Care (Signed)
Immediate Anesthesia Transfer of Care Note  Patient: Laura Cabrera  Procedure(s) Performed: INTRAMEDULLARY (IM) NAIL INTERTROCHANTRIC (N/A )  Patient Location: PACU  Anesthesia Type:General  Level of Consciousness: awake  Airway & Oxygen Therapy: Patient Spontanous Breathing  Post-op Assessment: Report given to RN and Patient moving all extremities X 4  Post vital signs: Reviewed and stable  Last Vitals:  Vitals Value Taken Time  BP    Temp 36.2 C 05/06/2018  2:12 PM  Pulse 72 05/06/2018  2:13 PM  Resp 17 05/06/2018  2:13 PM  SpO2 93 % 05/06/2018  2:13 PM  Vitals shown include unvalidated device data.  Last Pain:  Vitals:   05/06/18 0902  TempSrc: Oral  PainSc:          Complications: No apparent anesthesia complications

## 2018-05-06 NOTE — Anesthesia Preprocedure Evaluation (Addendum)
Anesthesia Evaluation  Patient identified by MRN, date of birth, ID band Patient awake    Reviewed: Allergy & Precautions, NPO status , Patient's Chart, lab work & pertinent test results, reviewed documented beta blocker date and time   History of Anesthesia Complications Negative for: history of anesthetic complications  Airway Mallampati: III  TM Distance: <3 FB Neck ROM: Full    Dental  (+) Upper Dentures, Lower Dentures   Pulmonary neg pulmonary ROS,    breath sounds clear to auscultation       Cardiovascular hypertension, Pt. on home beta blockers and Pt. on medications + Peripheral Vascular Disease   Rhythm:Regular     Neuro/Psych CVA negative psych ROS   GI/Hepatic negative GI ROS, Neg liver ROS,   Endo/Other  negative endocrine ROS  Renal/GU CRFRenal disease     Musculoskeletal  left hip fracture   Abdominal   Peds  Hematology Plavix last took 3/24   Anesthesia Other Findings Cardiovascular: Heart size upper limits normal. No pleural effusion. Satisfactory opacification of pulmonary arteries noted, and there is no evidence of pulmonary emboli. Moderate coronary calcifications. Good contrast opacification of the thoracic aorta, with maximum transverse dimensions as follows:  3.4 cm sinuses of Valsalva  3.1 cm sino-tubular junction  4.6 cm mid ascending (previously 4.5 cm)  3.7 cm distal ascending/proximal arch  3.4 cm distal arch  3 cm proximal descending  2.6 cm distal descending  Reproductive/Obstetrics                            Anesthesia Physical Anesthesia Plan  ASA: II  Anesthesia Plan: General   Post-op Pain Management:    Induction: Intravenous and Rapid sequence  PONV Risk Score and Plan: 3 and Ondansetron and Dexamethasone  Airway Management Planned: Oral ETT  Additional Equipment: None  Intra-op Plan:   Post-operative Plan: Extubation in  OR  Informed Consent: I have reviewed the patients History and Physical, chart, labs and discussed the procedure including the risks, benefits and alternatives for the proposed anesthesia with the patient or authorized representative who has indicated his/her understanding and acceptance.     Dental advisory given  Plan Discussed with: CRNA and Surgeon  Anesthesia Plan Comments:         Anesthesia Quick Evaluation

## 2018-05-06 NOTE — Progress Notes (Signed)
Orthopedic Tech Progress Note Patient Details:  Laura Cabrera [birth year] [id number]  Musculoskeletal Traction Type of Traction: Bucks Skin Traction Traction Location: lle Traction Weight: 10 lbs   Post Interventions Patient Tolerated: Well Instructions Provided: Care of device, Adjustment of device   Karolee Stamps 05/06/2018, 1:29 AM

## 2018-05-06 NOTE — ED Notes (Signed)
ED TO INPATIENT HANDOFF REPORT  ED Nurse Name and Phone #:  Myriam Jacobson [phone]  S Name/Age/Gender Janene Madeira 85 y.o. female Room/Bed: 034C/034C  Code Status   Code Status: Not on file  Home/SNF/Other Home Patient oriented to: self, place and situation Is this baseline? Yes   Triage Complete: Triage complete  Chief Complaint Fall  Triage Note Per EMS pt was found on the living room floor on her left side. Pt reports she fell. EMS reports seeing a folded rug corner near pt. EMS reports pt left leg was rotated out. Per EMS, Pt reported pain of 10 which decreased to 2 after leg was splinted. Pt refused pains meds. Pt was also started NaCl KVO en route. Pt denies LOC. Pt is on Xarelta, propanolol, and is allergic to penicillin. PT has hx of stroke, HTN, reynards, and a right mystectomy.   Allergies Allergies  Allergen Reactions  . Penicillins   . Sulfamethoxazole Other (See Comments)    Unsure- childhood  . Penicillin G Rash    Level of Care/Admitting Diagnosis ED Disposition    ED Disposition Condition [facility] Area: [place] [[id number]]  Level of Care: Telemetry Medical [104]  Diagnosis: Closed displaced oblique fracture of shaft of femur [facility]) [[id number]]  Admitting Physician: Lenore Cordia [[id number]]  Attending Physician: Lenore Cordia [[id number]]  Estimated length of stay: past midnight tomorrow  Certification:: I certify this patient will need inpatient services for at least 2 midnights  PT Class (Do Not Modify): Inpatient [101]  PT Acc Code (Do Not Modify): Private [1]       B Medical/Surgery History Past Medical History:  Diagnosis Date  . Hypertension   . Raynaud disease   . Stroke [facility])    Past Surgical History:  Procedure Laterality Date  . MASTECTOMY       A IV Location/Drains/Wounds Patient Lines/Drains/Airways Status   Active Line/Drains/Airways    Name:   Placement date:   Placement time:   Site:   Days:    Peripheral IV 05/05/18 Right Forearm   05/05/18    2141    Forearm   1          Intake/Output Last 24 hours No intake or output data in the 24 hours ending 05/06/18 0103  Labs/Imaging Results for orders placed or performed during the hospital encounter of 05/05/18 (from the past 48 hour(s))  Urinalysis, Routine w reflex microscopic     Status: Abnormal   Collection Time: 05/05/18 10:01 PM  Result Value Ref Range   Color, Urine STRAW (A) YELLOW   APPearance CLEAR CLEAR   Specific Gravity, Urine 1.012 1.005 - 1.030   pH 7.0 5.0 - 8.0   Glucose, UA NEGATIVE NEGATIVE mg/dL   Hgb urine dipstick NEGATIVE NEGATIVE   Bilirubin Urine NEGATIVE NEGATIVE   Ketones, ur NEGATIVE NEGATIVE mg/dL   Protein, ur NEGATIVE NEGATIVE mg/dL   Nitrite NEGATIVE NEGATIVE   Leukocytes,Ua NEGATIVE NEGATIVE    Comment: Performed at [facility], [address], [place],  [address]  CBC with Differential     Status: Abnormal   Collection Time: 05/05/18 11:38 PM  Result Value Ref Range   WBC 12.2 (H) 4.0 - 10.5 K/uL   RBC 3.20 (L) 3.87 - 5.11 MIL/uL   Hemoglobin 9.9 (L) 12.0 - 15.0 g/dL   HCT 31.9 (L) 36.0 - 46.0 %   MCV 99.7 80.0 - 100.0 fL   MCH 30.9  26.0 - 34.0 pg   MCHC 31.0 30.0 - 36.0 g/dL   RDW 13.3 11.5 - 15.5 %   Platelets 130 (L) 150 - 400 K/uL   nRBC 0.0 0.0 - 0.2 %   Neutrophils Relative % 85 %   Neutro Abs 10.4 (H) 1.7 - 7.7 K/uL   Lymphocytes Relative 8 %   Lymphs Abs 0.9 0.7 - 4.0 K/uL   Monocytes Relative 5 %   Monocytes Absolute 0.6 0.1 - 1.0 K/uL   Eosinophils Relative 1 %   Eosinophils Absolute 0.1 0.0 - 0.5 K/uL   Basophils Relative 0 %   Basophils Absolute 0.0 0.0 - 0.1 K/uL   Immature Granulocytes 1 %   Abs Immature Granulocytes 0.08 (H) 0.00 - 0.07 K/uL    Comment: Performed at [facility] [address], [place], [place] [address]  CK     Status: None   Collection Time: 05/05/18 11:38 PM  Result Value Ref Range   Total CK 168 38 - 234 U/L     Comment: Performed at [facility], [place] [address], [place], [place] [address]  Comprehensive metabolic panel     Status: Abnormal   Collection Time: 05/05/18 11:38 PM  Result Value Ref Range   Sodium 136 135 - 145 mmol/L   Potassium 4.3 3.5 - 5.1 mmol/L   Chloride 101 98 - 111 mmol/L   CO2 24 22 - 32 mmol/L   Glucose, Bld 139 (H) 70 - 99 mg/dL   BUN 20 8 - 23 mg/dL   Creatinine, Ser 1.22 (H) 0.44 - 1.00 mg/dL   Calcium 9.1 8.9 - 10.3 mg/dL   Total Protein 6.3 (L) 6.5 - 8.1 g/dL   Albumin 3.3 (L) 3.5 - 5.0 g/dL   AST 40 15 - 41 U/L   ALT 13 0 - 44 U/L   Alkaline Phosphatase 65 38 - 126 U/L   Total Bilirubin 0.9 0.3 - 1.2 mg/dL   GFR calc non Af Amer 39 (L) >60 mL/min   GFR calc Af Amer 45 (L) >60 mL/min   Anion gap 11 5 - 15    Comment: Performed at [facility] [address], [place], [place] [address]  Type and screen [place]     Status: None   Collection Time: 05/05/18 11:41 PM  Result Value Ref Range   ABO/RH(D) A POS    Antibody Screen NEG    Sample Expiration      05/08/2018 Performed at [facility], [place] [address] [place], [place] [address]   ABO/Rh     Status: None (Preliminary result)   Collection Time: 05/05/18 11:41 PM  Result Value Ref Range   ABO/RH(D)      A POS Performed at [facility] [address] [place], [place] [address]    Dg Chest 2 View  Result Date: 05/05/2018 CLINICAL DATA:  85 year old post fall with confusion. Unwitnessed fall, found on living room floor on left side. EXAM: CHEST - 2 VIEW COMPARISON:  Chest CT 04/08/2018 FINDINGS: Cardiomegaly unchanged from prior. Tortuosity and atherosclerosis of the thoracic aorta unchanged. No acute airspace disease, pleural effusion, or pneumothorax. No pulmonary edema. No acute osseous abnormalities are seen. Remote left rib fractures unchanged. IMPRESSION: 1. No acute findings. 2. Unchanged cardiomegaly.  Aortic Atherosclerosis (ICD10-I70.0). Electronically  Signed   By: Keith Rake M.D.   On: 05/05/2018 23:14   Dg Pelvis 1-2 Views  Result  Date: 05/05/2018 CLINICAL DATA:  85 year old post fall with confusion. Found on floor lying on left side. Left lower extremity pain. EXAM: PELVIS - 1-2 VIEW COMPARISON:  Concurrent left femur radiographs. Pelvic radiograph 04/03/2017 FINDINGS: Left proximal femoral shaft fracture is partially included. Femoral head remains seated. No additional fracture of the pelvis. Pubic rami are intact. Bones are under mineralized. IMPRESSION: Left proximal femoral shaft fracture, partially included. No additional pelvic fracture. Electronically Signed   By: Keith Rake M.D.   On: 05/05/2018 23:16   Ct Head Wo Contrast  Result Date: 05/05/2018 CLINICAL DATA:  85 year old female found down. On Xarelto. EXAM: CT HEAD WITHOUT CONTRAST CT CERVICAL SPINE WITHOUT CONTRAST TECHNIQUE: Multidetector CT imaging of the head and cervical spine was performed following the standard protocol without intravenous contrast. Multiplanar CT image reconstructions of the cervical spine were also generated. COMPARISON:  None available. FINDINGS: CT HEAD FINDINGS Brain: Confluent bilateral cerebral white matter hypodensity. Chronic appearing left PICA infarct of the cerebellum. No acute intracranial hemorrhage identified. No midline shift, mass effect, or evidence of intracranial mass lesion. No ventriculomegaly. No acute cortically based infarct identified. Vascular: Extensive Calcified atherosclerosis at the skull base.6 No suspicious intracranial vascular hyperdensity. Skull: Osteopenia at the skull base. No acute osseous abnormality identified. Sinuses/Orbits: Visualized paranasal sinuses and mastoids are clear. Other: No scalp hematoma identified. Negative orbits. CT CERVICAL SPINE FINDINGS Alignment: straightening of cervical lordosis with mild degenerative appearing anterolisthesis at C3-C4 and C7-T1. Skull base and vertebrae: Severe degenerative  changes at the skull base through odontoid. Visualized skull base is intact. No atlanto-occipital dissociation. No acute osseous abnormality identified. Soft tissues and spinal canal: No prevertebral fluid or swelling. No visible canal hematoma. Calcified carotid atherosclerosis, otherwise negative noncontrast neck soft tissues. Disc levels: Very severe bilateral C1-C2 joint space loss, sclerosis and bulky osteophytosis (coronal image 20). Bilateral C2-C3 posterior element ankylosis. Widespread bilateral cervical facet arthropathy. Severe disc and endplate degeneration D9-M4 and C6-C7, with suspected mild degenerative spinal stenosis at those levels. Upper chest: Visible upper thoracic levels and lung apices appear grossly negative. IMPRESSION: 1. No acute traumatic injury identified in the head or cervical spine. 2. No acute intracranial abnormality. Chronic appearing left PICA infarct. Advanced chronic small vessel disease. 3. 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 Moderate and severe cervical spine degeneration elsewhere. Electronically Signed   By: Genevie Ann M.D.   On: 05/05/2018 23:28   Ct Cervical Spine Wo Contrast  Result Date: 05/05/2018 CLINICAL DATA:  85 year old female found down. On Xarelto. EXAM: CT HEAD WITHOUT CONTRAST CT CERVICAL SPINE WITHOUT CONTRAST TECHNIQUE: Multidetector CT imaging of the head and cervical spine was performed following the standard protocol without intravenous contrast. Multiplanar CT image reconstructions of the cervical spine were also generated. COMPARISON:  None available. FINDINGS: CT HEAD FINDINGS Brain: Confluent bilateral cerebral white matter hypodensity. Chronic appearing left PICA infarct of the cerebellum. No acute intracranial hemorrhage identified. No midline shift, mass effect, or evidence of intracranial mass lesion. No ventriculomegaly. No acute cortically based infarct identified. Vascular: Extensive Calcified  atherosclerosis at the skull base.6 No suspicious intracranial vascular hyperdensity. Skull: Osteopenia at the skull base. No acute osseous abnormality identified. Sinuses/Orbits: Visualized paranasal sinuses and mastoids are clear. Other: No scalp hematoma identified. Negative orbits. CT CERVICAL SPINE FINDINGS Alignment: straightening of cervical lordosis with mild degenerative appearing anterolisthesis at C3-C4 and C7-T1. Skull base and vertebrae: Severe degenerative changes at the skull base through odontoid. Visualized skull base is intact. No  atlanto-occipital dissociation. No acute osseous abnormality identified. Soft tissues and spinal canal: No prevertebral fluid or swelling. No visible canal hematoma. Calcified carotid atherosclerosis, otherwise negative noncontrast neck soft tissues. Disc levels: Very severe bilateral C1-C2 joint space loss, sclerosis and bulky osteophytosis (coronal image 20). Bilateral C2-C3 posterior element ankylosis. Widespread bilateral cervical facet arthropathy. Severe disc and endplate degeneration O1-Y0 and C6-C7, with suspected mild degenerative spinal stenosis at those levels. Upper chest: Visible upper thoracic levels and lung apices appear grossly negative. IMPRESSION: 1. No acute traumatic injury identified in the head or cervical spine. 2. No acute intracranial abnormality. Chronic appearing left PICA infarct. Advanced chronic small vessel disease. 3. 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 Moderate and severe cervical spine degeneration elsewhere. Electronically Signed   By: Genevie Ann M.D.   On: 05/05/2018 23:28   Dg Knee Complete 4 Views Left  Result Date: 05/05/2018 CLINICAL DATA:  85 year old post fall with confusion. Found on floor lying on left side. Left lower extremity pain. EXAM: LEFT KNEE - COMPLETE 4+ VIEW COMPARISON:  None. FINDINGS: No evidence of fracture, dislocation, or joint effusion. Mild medial greater than lateral  tibiofemoral joint space narrowing. Soft tissues are unremarkable. There are vascular calcifications. IMPRESSION: No acute fracture or subluxation of the left knee. Electronically Signed   By: Keith Rake M.D.   On: 05/05/2018 23:15   Dg Femur Min 2 Views Left  Result Date: 05/05/2018 CLINICAL DATA:  85 year old post fall with confusion. Found on floor lying on left side. Left lower extremity pain. EXAM: LEFT FEMUR 2 VIEWS COMPARISON:  None. FINDINGS: Proximal femoral shaft fracture with displacement of approximately 1 shaft width and apex lateral angulation. Osseous overriding approximately 3.3 cm. Nondisplaced component extends to the subtrochanteric region but no definite intertrochanteric involvement. Hip and knee alignment are maintained. There are vascular calcifications. IMPRESSION: Displaced angulated proximal femoral shaft fracture. Electronically Signed   By: Keith Rake M.D.   On: 05/05/2018 23:18    Pending Labs Unresulted Labs (From admission, onward)   None      Vitals/Pain Today's Vitals   05/05/18 2152 05/05/18 2153 05/05/18 2330 05/06/18 0030  BP: (!) 156/116  (!) 158/50 (!) 141/87  Pulse: (!) 54  62 74  Resp: 20  16   Temp: 98.3 F (36.8 C)     SpO2: 99%  98% 100%  PainSc:  2       Isolation Precautions No active isolations  Medications Medications  fentaNYL (SUBLIMAZE) injection 50 mcg (50 mcg Intravenous Given 05/06/18 0034)    Mobility walks Moderate fall risk   Focused Assessments ms   R Recommendations: See Admitting Provider Note  Report given to:   Additional Notes:

## 2018-05-06 NOTE — Progress Notes (Signed)
Removed 1 ring and a pair of earrings and hearing aids, placed in the cabinet together with other belongings.

## 2018-05-06 NOTE — Consult Note (Signed)
Orthopaedic Trauma Service (OTS) Consult   Patient ID: Laura Cabrera MRN: [id number] DOB/AGE: [birth year] 85 y.o.  Reason for Consult: Left proximal femur fracture Referring Physician: [facility]  HPI: Laura Cabrera is an 85 y.o. female being seen in consultation of emergency medicine provider for left proximal femur fracture. Patient has medical history significant for hypertension, hyperlipidemia, history of stroke, CKD stage III, and ascending thoracic aortic aneurysm who presented to [facility] on 05/05/18 after a ground-level fall while ambulating at home. She felt as if her leg was swept from underneath her and she fell on her left side with immediate severe pain and inability to bear weight. Imaging in the ED revealed a left displaced and angulated proximal femoral shaft fracture. Orthopaedics was consulted and patient was placed in 10lbs Buck's traction. This morning patient is doing well. Pain well controlled. Patient denies head injury or loss of consciousness during her fall. Denies numbness or tingling in leg. Denies any other injuries.  Patient has a history of TIA and takes Plavix 75 mg daily with last dose taken morning of 05/05/2018.  Currently being held. Her only other medication is propranolol for HTN.  Last year patient had a right hip fracture in February 2019 and then a right-sided proximal femur fracture in March 2019 both of which were treated conservatively.  Patient lives in a single level home by herself. She ambulates without any assistive device but has used a walker in the past after previous hip/femur fractures.  Past Medical History:  Diagnosis Date  . Hypertension   . Raynaud disease   . Stroke [facility])     Past Surgical History:  Procedure Laterality Date  . MASTECTOMY      Family History  Problem Relation Age of Onset  . Stroke Mother   . Hypertension Father   . Coronary artery disease Father     Social History:  reports that she has never  smoked. She has never used smokeless tobacco. She reports that she does not drink alcohol or use drugs.  Allergies:  Allergies  Allergen Reactions  . Penicillins   . Sulfamethoxazole Other (See Comments)    Unsure- childhood  . Penicillin G Rash    Medications: I have reviewed the patient's current medications.  ROS: Constitutional: No fever or chills Vision: No changes in vision ENT: No difficulty swallowing CV: No chest pain Pulm: No SOB or wheezing GI: No nausea or vomiting GU: No urgency or inability to hold urine Skin: No poor wound healing Neurologic: No numbness or tingling Psychiatric: No depression or anxiety Heme: No bruising Allergic: No reaction to medications or food   Exam: Blood pressure (!) 148/81, pulse 64, temperature 98.4 F (36.9 C), temperature source Oral, resp. rate 14, height 5\' 3"  (1.6 m), weight 52 kg, SpO2 97 %. General: Laying in bed, calm and comfortably, no acute distress Orientation: Alert and oriented x 3 Mood and Affect: Mood and affect appropriate Gait: Not assessed due to known fracture and Buck's traction in place Coordination and balance: Within normal limits  Cardiac: Heart regular rate and rhythm  Respiratory: CTA anterior lung fields bilaterally  LLE: Buck's traction in place. Skin without lesions. Moderate tenderness to palpation of thigh. No tenderness to palpation of hip, knee, or lower leg. Knee and hip ROM unable to be assessed. Able to wiggle toes. Sensation intact to light touch. Foot warm and well perfused Neurovascularly intact  RLE: Skin without lesions. No tenderness to palpation.  Full  painless ROM, full strength in each muscle group without evidence of instability. Motor function intact. Sensation intact to light touch. Neurovascularly intact.   LUE: Skin without lesions. No tenderness to palpation.  Full painless ROM, full strength in each muscle group without evidence of instability. Motor function intact. Sensation  intact to light touch. Neurovascularly intact.   RUE: Skin without lesions. No tenderness to palpation. Full painless ROM, full strength in each muscle group without evidence of instability. Motor function intact. Sensation intact to light touch. Neurovascularly intact.   Medical Decision Making: Imaging: AP and lateral of left femur show a displaced and angulated proximal femoral shaft fracture with approximately 1 shaft width of displacement.  Labs:  Results for orders placed or performed during the hospital encounter of 05/05/18 (from the past 24 hour(s))  Urinalysis, Routine w reflex microscopic     Status: Abnormal   Collection Time: 05/05/18 10:01 PM  Result Value Ref Range   Color, Urine STRAW (A) YELLOW   APPearance CLEAR CLEAR   Specific Gravity, Urine 1.012 1.005 - 1.030   pH 7.0 5.0 - 8.0   Glucose, UA NEGATIVE NEGATIVE mg/dL   Hgb urine dipstick NEGATIVE NEGATIVE   Bilirubin Urine NEGATIVE NEGATIVE   Ketones, ur NEGATIVE NEGATIVE mg/dL   Protein, ur NEGATIVE NEGATIVE mg/dL   Nitrite NEGATIVE NEGATIVE   Leukocytes,Ua NEGATIVE NEGATIVE  CBC with Differential     Status: Abnormal   Collection Time: 05/05/18 11:38 PM  Result Value Ref Range   WBC 12.2 (H) 4.0 - 10.5 K/uL   RBC 3.20 (L) 3.87 - 5.11 MIL/uL   Hemoglobin 9.9 (L) 12.0 - 15.0 g/dL   HCT 31.9 (L) 36.0 - 46.0 %   MCV 99.7 80.0 - 100.0 fL   MCH 30.9 26.0 - 34.0 pg   MCHC 31.0 30.0 - 36.0 g/dL   RDW 13.3 11.5 - 15.5 %   Platelets 130 (L) 150 - 400 K/uL   nRBC 0.0 0.0 - 0.2 %   Neutrophils Relative % 85 %   Neutro Abs 10.4 (H) 1.7 - 7.7 K/uL   Lymphocytes Relative 8 %   Lymphs Abs 0.9 0.7 - 4.0 K/uL   Monocytes Relative 5 %   Monocytes Absolute 0.6 0.1 - 1.0 K/uL   Eosinophils Relative 1 %   Eosinophils Absolute 0.1 0.0 - 0.5 K/uL   Basophils Relative 0 %   Basophils Absolute 0.0 0.0 - 0.1 K/uL   Immature Granulocytes 1 %   Abs Immature Granulocytes 0.08 (H) 0.00 - 0.07 K/uL  CK     Status: None    Collection Time: 05/05/18 11:38 PM  Result Value Ref Range   Total CK 168 38 - 234 U/L  Comprehensive metabolic panel     Status: Abnormal   Collection Time: 05/05/18 11:38 PM  Result Value Ref Range   Sodium 136 135 - 145 mmol/L   Potassium 4.3 3.5 - 5.1 mmol/L   Chloride 101 98 - 111 mmol/L   CO2 24 22 - 32 mmol/L   Glucose, Bld 139 (H) 70 - 99 mg/dL   BUN 20 8 - 23 mg/dL   Creatinine, Ser 1.22 (H) 0.44 - 1.00 mg/dL   Calcium 9.1 8.9 - 10.3 mg/dL   Total Protein 6.3 (L) 6.5 - 8.1 g/dL   Albumin 3.3 (L) 3.5 - 5.0 g/dL   AST 40 15 - 41 U/L   ALT 13 0 - 44 U/L   Alkaline Phosphatase 65 38 - 126 U/L  Total Bilirubin 0.9 0.3 - 1.2 mg/dL   GFR calc non Af Amer 39 (L) >60 mL/min   GFR calc Af Amer 45 (L) >60 mL/min   Anion gap 11 5 - 15  Type and screen [place]     Status: None   Collection Time: 05/05/18 11:41 PM  Result Value Ref Range   ABO/RH(D) A POS    Antibody Screen NEG    Sample Expiration      05/08/2018 Performed at [facility], [place] [address] [place], [place] [address]   ABO/Rh     Status: None   Collection Time: 05/05/18 11:41 PM  Result Value Ref Range   ABO/RH(D)      A POS Performed at [facility] [address] [place], [place] [address]   MRSA PCR Screening     Status: None   Collection Time: 05/06/18  2:31 AM  Result Value Ref Range   MRSA by PCR NEGATIVE NEGATIVE  Basic metabolic panel     Status: Abnormal   Collection Time: 05/06/18  3:48 AM  Result Value Ref Range   Sodium 135 135 - 145 mmol/L   Potassium 4.0 3.5 - 5.1 mmol/L   Chloride 103 98 - 111 mmol/L   CO2 24 22 - 32 mmol/L   Glucose, Bld 159 (H) 70 - 99 mg/dL   BUN 20 8 - 23 mg/dL   Creatinine, Ser 1.20 (H) 0.44 - 1.00 mg/dL   Calcium 8.8 (L) 8.9 - 10.3 mg/dL   GFR calc non Af Amer 39 (L) >60 mL/min   GFR calc Af Amer 46 (L) >60 mL/min   Anion gap 8 5 - 15  CBC     Status: Abnormal   Collection Time: 05/06/18  3:48 AM  Result Value Ref Range    WBC 9.9 4.0 - 10.5 K/uL   RBC 2.98 (L) 3.87 - 5.11 MIL/uL   Hemoglobin 9.6 (L) 12.0 - 15.0 g/dL   HCT 29.1 (L) 36.0 - 46.0 %   MCV 97.7 80.0 - 100.0 fL   MCH 32.2 26.0 - 34.0 pg   MCHC 33.0 30.0 - 36.0 g/dL   RDW 13.4 11.5 - 15.5 %   Platelets 117 (L) 150 - 400 K/uL   nRBC 0.0 0.0 - 0.2 %     Medical history and chart was reviewed  Assessment/Plan: 85 year old female s/p ground-level fall at home, resulting in left proximal femur fracture. Patient currently in 10lb  Buck's tractions. I would recommend proceeding with intermedullary nailing of the left femur later today. Following surgery patient will be weightbearing as tolerated to the left lower extremity. She will work with physical therapy.   Discussed with patient the risks, benefits, and alternatives to surgical intervention. Risks discussed included bleeding requiring blood transfusion, bleeding causing a hematoma, infection, malunion, nonunion, damage to surrounding nerves and blood vessels, pain, hardware prominence or irritation, hardware failure, stiffness, post-traumatic arthritis, DVT/PE, compartment syndrome, and even death. Patient would like to proceed with surgery. All questions were answered to her satisfaction. Consent was obtained.     Rudell Ortman A. [facility] ?[phone]? (phone)

## 2018-05-06 NOTE — Anesthesia Procedure Notes (Signed)
Procedure Name: Intubation Date/Time: 05/06/2018 12:27 PM Performed by: Barrington Ellison, CRNA Pre-anesthesia Checklist: Patient identified, Emergency Drugs available, Suction available and Patient being monitored Patient Re-evaluated:Patient Re-evaluated prior to induction Oxygen Delivery Method: Circle System Utilized Preoxygenation: Pre-oxygenation with 100% oxygen Induction Type: IV induction and Rapid sequence Laryngoscope Size: Mac and 3 Grade View: Grade I Tube type: Oral Tube size: 7.0 mm Number of attempts: 1 Airway Equipment and Method: Stylet and Oral airway Placement Confirmation: ETT inserted through vocal cords under direct vision,  positive ETCO2 and breath sounds checked- equal and bilateral Secured at: 22 cm Tube secured with: Tape Dental Injury: Teeth and Oropharynx as per pre-operative assessment

## 2018-05-06 NOTE — H&P (Signed)
History and Physical    Laura Cabrera TMH:[id number] DOB: 01-16-1926 DOA: 05/05/2018  PCP: Dineen Kid, MD  Patient coming from: Home via EMS.  I have personally briefly reviewed patient's old medical records in [place]  Chief Complaint: Fall, left leg pain.  HPI: Laura Cabrera is a 85 y.o. female with medical history significant for hypertension, hyperlipidemia, history of stroke, CKD stage III, and ascending thoracic aortic aneurysm who presents to the ED after a fall at home.  Last year patient had a right hip fracture in February 2019 and then a right-sided proximal femur fracture in March 2019 both of which were treated conservatively.  She says she has been using a walker since that time and recently was able to progress to the point where she is able to ambulate on her own power.    On day of admission, 05/05/2018, she was walking from one room to the other when she felt as if her leg was swept from underneath her and she fell on her left side with immediate severe pain and inability to bear weight.  She denied having any associated chest pain, palpitations, dyspnea, cough, lightheadedness, dizziness, loss of consciousness, injury to head, loss of bowel/bladder, or seizure-like activity.  EMS were called and per documentation reported that her leg was externally rotated and a splint was put in place.  Per their report there was a folded rug corner near the patient.  Patient has a history of TIA and takes Plavix 75 mg daily with last dose taken morning of 05/05/2018.  Her only other medication is propranolol.  ED Course:  Initial vitals showed BP 156/116, pulse 54, RR 20, temp 98.3 Fahrenheit, SPO2 99% on room air.  Labs are notable for WBC 12.2, hemoglobin 9.9, platelets 130, BUN 20, creatinine 1.22, GFR 39, CK 168, urinalysis negative for UTI.  Portable chest x-ray was without acute cardiopulmonary process.  CT head and cervical spine without contrast were negative for  acute traumatic injury in the head or C-spine or acute intracranial abnormality.  There was a chronic appearing left PICA infarct with advanced chronic small vessel disease and very severe C1-C2 spinal degeneration.  X-ray of the pelvis and left femur showed a displaced angulated proximal femoral shaft fracture.  Left knee x-ray was negative for acute fracture or subluxation of the left knee.  EDP Benedetto Goad, PA discussed with on-call orthopedics, Dr. Marcelino Scot who recommended placing patient in Buck's traction, and will see in a.m.  Hospitalist consulted to admit.   Review of Systems: As per HPI otherwise 10 point review of systems negative.    Past Medical History:  Diagnosis Date   Hypertension    Raynaud disease    Stroke [facility])     Past Surgical History:  Procedure Laterality Date   MASTECTOMY       reports that she has never smoked. She has never used smokeless tobacco. She reports that she does not drink alcohol or use drugs.  Allergies  Allergen Reactions   Penicillins    Sulfamethoxazole Other (See Comments)    Unsure- childhood   Penicillin G Rash    Family History  Problem Relation Age of Onset   Stroke Mother    Hypertension Father    Coronary artery disease Father      Prior to Admission medications   Medication Sig Start Date End Date Taking? Authorizing Provider  clopidogrel (PLAVIX) 75 MG tablet Take 75 mg by mouth daily. 04/20/18  Yes [provider]  latanoprost (XALATAN) 0.005 % ophthalmic solution Place 1 drop into both eyes at bedtime. 03/13/18  Yes [provider]  propranolol (INDERAL) 40 MG tablet Take 40 mg by mouth daily. 12/10/17  Yes [provider]    Physical Exam: Vitals:   05/05/18 2330 05/06/18 0030 05/06/18 0045 05/06/18 0100  BP: (!) 158/50 (!) 141/87 133/90 133/71  Pulse: 62 74 77 62  Resp: 16  17 20   Temp:      SpO2: 98% 100% 96% (!) 88%    Constitutional: Elderly woman resting supine in bed,  NAD, calm, comfortable with intermittent left lower extremity pain Eyes: PERRL, lids and conjunctivae normal ENMT: Mucous membranes are moist. Posterior pharynx clear of any exudate or lesions.  Neck: normal, supple, no masses. Respiratory: clear to auscultation anteriorly, no wheezing, no crackles. Normal respiratory effort. No accessory muscle use.  Cardiovascular: Regular rate and rhythm, no murmurs / rubs / gallops. No extremity edema.  Abdomen: no tenderness, no masses palpated. No hepatosplenomegaly. Bowel sounds positive.  Musculoskeletal: Range of motion limited left lower extremity due to pain from femur fracture, left foot everted. Skin: no rashes, lesions, ulcers. No induration Neurologic: CN 2-12 grossly intact. Sensation intact, DTR normal. Strength 5/5 in all 4.  Psychiatric: Normal judgment and insight. Alert and oriented x 3. Normal mood.     Labs on Admission: I have personally reviewed following labs and imaging studies  CBC: Recent Labs  Lab 05/05/18 2338  WBC 12.2*  NEUTROABS 10.4*  HGB 9.9*  HCT 31.9*  MCV 99.7  PLT 500*   Basic Metabolic Panel: Recent Labs  Lab 05/05/18 2338  NA 136  K 4.3  CL 101  CO2 24  GLUCOSE 139*  BUN 20  CREATININE 1.22*  CALCIUM 9.1   GFR: CrCl cannot be calculated (Unknown ideal weight.). Liver Function Tests: Recent Labs  Lab 05/05/18 2338  AST 40  ALT 13  ALKPHOS 65  BILITOT 0.9  PROT 6.3*  ALBUMIN 3.3*   No results for input(s): LIPASE, AMYLASE in the last 168 hours. No results for input(s): AMMONIA in the last 168 hours. Coagulation Profile: No results for input(s): INR, PROTIME in the last 168 hours. Cardiac Enzymes: Recent Labs  Lab 05/05/18 2338  CKTOTAL 168   BNP (last 3 results) No results for input(s): PROBNP in the last 8760 hours. HbA1C: No results for input(s): HGBA1C in the last 72 hours. CBG: No results for input(s): GLUCAP in the last 168 hours. Lipid Profile: No results for input(s):  CHOL, HDL, LDLCALC, TRIG, CHOLHDL, LDLDIRECT in the last 72 hours. Thyroid Function Tests: No results for input(s): TSH, T4TOTAL, FREET4, T3FREE, THYROIDAB in the last 72 hours. Anemia Panel: No results for input(s): VITAMINB12, FOLATE, FERRITIN, TIBC, IRON, RETICCTPCT in the last 72 hours. Urine analysis:    Component Value Date/Time   COLORURINE STRAW (A) 05/05/2018 2201   APPEARANCEUR CLEAR 05/05/2018 2201   LABSPEC 1.012 05/05/2018 2201   PHURINE 7.0 05/05/2018 2201   GLUCOSEU NEGATIVE 05/05/2018 2201   HGBUR NEGATIVE 05/05/2018 2201   BILIRUBINUR NEGATIVE 05/05/2018 2201   KETONESUR NEGATIVE 05/05/2018 2201   PROTEINUR NEGATIVE 05/05/2018 2201   NITRITE NEGATIVE 05/05/2018 2201   LEUKOCYTESUR NEGATIVE 05/05/2018 2201    Radiological Exams on Admission: Dg Chest 2 View  Result Date: 05/05/2018 CLINICAL DATA:  85 year old post fall with confusion. Unwitnessed fall, found on living room floor on left side. EXAM: CHEST - 2 VIEW COMPARISON:  Chest CT 04/08/2018 FINDINGS:  Cardiomegaly unchanged from prior. Tortuosity and atherosclerosis of the thoracic aorta unchanged. No acute airspace disease, pleural effusion, or pneumothorax. No pulmonary edema. No acute osseous abnormalities are seen. Remote left rib fractures unchanged. IMPRESSION: 1. No acute findings. 2. Unchanged cardiomegaly.  Aortic Atherosclerosis (ICD10-I70.0). Electronically Signed   By: Keith Rake M.D.   On: 05/05/2018 23:14   Dg Pelvis 1-2 Views  Result Date: 05/05/2018 CLINICAL DATA:  85 year old post fall with confusion. Found on floor lying on left side. Left lower extremity pain. EXAM: PELVIS - 1-2 VIEW COMPARISON:  Concurrent left femur radiographs. Pelvic radiograph 04/03/2017 FINDINGS: Left proximal femoral shaft fracture is partially included. Femoral head remains seated. No additional fracture of the pelvis. Pubic rami are intact. Bones are under mineralized. IMPRESSION: Left proximal femoral shaft fracture,  partially included. No additional pelvic fracture. Electronically Signed   By: Keith Rake M.D.   On: 05/05/2018 23:16   Ct Head Wo Contrast  Result Date: 05/05/2018 CLINICAL DATA:  85 year old female found down. On Xarelto. EXAM: CT HEAD WITHOUT CONTRAST CT CERVICAL SPINE WITHOUT CONTRAST TECHNIQUE: Multidetector CT imaging of the head and cervical spine was performed following the standard protocol without intravenous contrast. Multiplanar CT image reconstructions of the cervical spine were also generated. COMPARISON:  None available. FINDINGS: CT HEAD FINDINGS Brain: Confluent bilateral cerebral white matter hypodensity. Chronic appearing left PICA infarct of the cerebellum. No acute intracranial hemorrhage identified. No midline shift, mass effect, or evidence of intracranial mass lesion. No ventriculomegaly. No acute cortically based infarct identified. Vascular: Extensive Calcified atherosclerosis at the skull base.6 No suspicious intracranial vascular hyperdensity. Skull: Osteopenia at the skull base. No acute osseous abnormality identified. Sinuses/Orbits: Visualized paranasal sinuses and mastoids are clear. Other: No scalp hematoma identified. Negative orbits. CT CERVICAL SPINE FINDINGS Alignment: straightening of cervical lordosis with mild degenerative appearing anterolisthesis at C3-C4 and C7-T1. Skull base and vertebrae: Severe degenerative changes at the skull base through odontoid. Visualized skull base is intact. No atlanto-occipital dissociation. No acute osseous abnormality identified. Soft tissues and spinal canal: No prevertebral fluid or swelling. No visible canal hematoma. Calcified carotid atherosclerosis, otherwise negative noncontrast neck soft tissues. Disc levels: Very severe bilateral C1-C2 joint space loss, sclerosis and bulky osteophytosis (coronal image 20). Bilateral C2-C3 posterior element ankylosis. Widespread bilateral cervical facet arthropathy. Severe disc and endplate  degeneration Y6-A6 and C6-C7, with suspected mild degenerative spinal stenosis at those levels. Upper chest: Visible upper thoracic levels and lung apices appear grossly negative. IMPRESSION: 1. No acute traumatic injury identified in the head or cervical spine. 2. No acute intracranial abnormality. Chronic appearing left PICA infarct. Advanced chronic small vessel disease. 3. 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 Moderate and severe cervical spine degeneration elsewhere. Electronically Signed   By: Genevie Ann M.D.   On: 05/05/2018 23:28   Ct Cervical Spine Wo Contrast  Result Date: 05/05/2018 CLINICAL DATA:  85 year old female found down. On Xarelto. EXAM: CT HEAD WITHOUT CONTRAST CT CERVICAL SPINE WITHOUT CONTRAST TECHNIQUE: Multidetector CT imaging of the head and cervical spine was performed following the standard protocol without intravenous contrast. Multiplanar CT image reconstructions of the cervical spine were also generated. COMPARISON:  None available. FINDINGS: CT HEAD FINDINGS Brain: Confluent bilateral cerebral white matter hypodensity. Chronic appearing left PICA infarct of the cerebellum. No acute intracranial hemorrhage identified. No midline shift, mass effect, or evidence of intracranial mass lesion. No ventriculomegaly. No acute cortically based infarct identified. Vascular: Extensive Calcified atherosclerosis at the skull base.6  No suspicious intracranial vascular hyperdensity. Skull: Osteopenia at the skull base. No acute osseous abnormality identified. Sinuses/Orbits: Visualized paranasal sinuses and mastoids are clear. Other: No scalp hematoma identified. Negative orbits. CT CERVICAL SPINE FINDINGS Alignment: straightening of cervical lordosis with mild degenerative appearing anterolisthesis at C3-C4 and C7-T1. Skull base and vertebrae: Severe degenerative changes at the skull base through odontoid. Visualized skull base is intact. No atlanto-occipital  dissociation. No acute osseous abnormality identified. Soft tissues and spinal canal: No prevertebral fluid or swelling. No visible canal hematoma. Calcified carotid atherosclerosis, otherwise negative noncontrast neck soft tissues. Disc levels: Very severe bilateral C1-C2 joint space loss, sclerosis and bulky osteophytosis (coronal image 20). Bilateral C2-C3 posterior element ankylosis. Widespread bilateral cervical facet arthropathy. Severe disc and endplate degeneration I2-L7 and C6-C7, with suspected mild degenerative spinal stenosis at those levels. Upper chest: Visible upper thoracic levels and lung apices appear grossly negative. IMPRESSION: 1. No acute traumatic injury identified in the head or cervical spine. 2. No acute intracranial abnormality. Chronic appearing left PICA infarct. Advanced chronic small vessel disease. 3. 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 Moderate and severe cervical spine degeneration elsewhere. Electronically Signed   By: Genevie Ann M.D.   On: 05/05/2018 23:28   Dg Knee Complete 4 Views Left  Result Date: 05/05/2018 CLINICAL DATA:  85 year old post fall with confusion. Found on floor lying on left side. Left lower extremity pain. EXAM: LEFT KNEE - COMPLETE 4+ VIEW COMPARISON:  None. FINDINGS: No evidence of fracture, dislocation, or joint effusion. Mild medial greater than lateral tibiofemoral joint space narrowing. Soft tissues are unremarkable. There are vascular calcifications. IMPRESSION: No acute fracture or subluxation of the left knee. Electronically Signed   By: Keith Rake M.D.   On: 05/05/2018 23:15   Dg Femur Min 2 Views Left  Result Date: 05/05/2018 CLINICAL DATA:  85 year old post fall with confusion. Found on floor lying on left side. Left lower extremity pain. EXAM: LEFT FEMUR 2 VIEWS COMPARISON:  None. FINDINGS: Proximal femoral shaft fracture with displacement of approximately 1 shaft width and apex lateral  angulation. Osseous overriding approximately 3.3 cm. Nondisplaced component extends to the subtrochanteric region but no definite intertrochanteric involvement. Hip and knee alignment are maintained. There are vascular calcifications. IMPRESSION: Displaced angulated proximal femoral shaft fracture. Electronically Signed   By: Keith Rake M.D.   On: 05/05/2018 23:18    EKG: Independently reviewed.  Sinus rhythm with PVC, motion artifact.  Assessment/Plan Principal Problem:   Closed displaced oblique fracture of shaft of femur ([place]) Active Problems:   Hypertension   History of stroke   Thoracic ascending aortic aneurysm (HCC)   CKD (chronic kidney disease), stage III ([place])  Laura Cabrera is a 85 y.o. female with medical history significant for hypertension, hyperlipidemia, history of stroke, CKD stage III, and ascending thoracic aortic aneurysm who is admitted with left proximal femoral shaft fracture.   Left proximal femoral shaft fracture: Appears to have occurred after mechanical fall.  Orthopedics consulted and to see in a.m.  Based on the revised cardiac risk index, patient has a 6% preoperative 30-day risk of death, MI, or cardiac arrest. -Patient placed in Buck's traction -Orthopedics consulted, to see in a.m. -Continue pain control -Will keep n.p.o., hold Plavix -Maintenance IV fluids overnight  Hypertension: Currently stable. Continue propranolol  History of stroke: On Plavix 75 mg daily, last dose morning of 05/05/2018. -Holding Plavix for now  CKD stage III: Chronic and stable.  Continue gentle fluids  overnight.  Thoracic ascending aortic aneurysm: Stable 4.6 cm ascending thoracic aortic aneurysm seen on CTA 04/08/2018.   DVT prophylaxis: SCDs Code Status: Full code, confirmed with patient Family Communication: None present at bedside on admission Disposition Plan: Pending further management of left femoral fracture, orthopedic recommendations Consults called:  Orthopedics Admission status: Inpatient   Zada Finders MD [facility] Pager [phone]  If 7PM-7AM, please contact night-coverage [email or web address]  05/06/2018, 1:32 AM

## 2018-05-06 NOTE — Op Note (Signed)
Orthopaedic Surgery Operative Note (CSN: [id number] ) Date of Surgery: 05/06/2018  Admit Date: 05/05/2018   Diagnoses: Pre-Op Diagnoses: Left femoral shaft fracture  Post-Op Diagnosis: Same  Procedures: CPT 27506-Intramedullary nailing of left femur fracture  Surgeons : Primary: Shona Needles, MD  Assistant: Patrecia Pace, PA-C  Location:OR 5   Anesthesia:General   Antibiotics: Ancef 2g preop   Tourniquet time:None  Estimated Blood BDZH:29 mL  Complications:None  Specimens:None   Implants: Implant Name Type Inv. Item Serial No. Manufacturer Lot No. LRB No. Used Action  11mm ti cann frn gt 334mm left     DEPUY SYNTHES [id number] N/A 1 Implanted  SCREW RECON 6.5X100MM - [id number] Screw SCREW RECON 6.5X100MM  SYNTHES TRAUMA [id number] N/A 1 Implanted  SCREW 6.5MM W/T25 STARDRIVE - [id number] Screw SCREW 6.5MM W/T25 STARDRIVE  SYNTHES TRAUMA [id number] N/A 1 Implanted  SCREW LOCKING 5.0X56MM - [id number] Screw SCREW LOCKING 5.0X56MM  SYNTHES TRAUMA [id number] N/A 1 Implanted  SCREW LOCKING 5.0MMX44MM - [id number] Screw SCREW LOCKING 5.0MMX44MM  SYNTHES TRAUMA [id number] N/A 1 Implanted    Indications for Surgery: 85 year old female has a history of TIA and stroke with chronic kidney disease on Plavix.  Had a ground-level fall with a left femoral shaft fracture.  She was placed in Buck's traction.  I recommended proceeding with intramedullary nailing of the left femur.  Risks and benefits were discussed with the patient and her daughter.  Risks included but not limited to bleeding, infection, malunion, nonunion, hardware failure, periprosthetic fracture, nerve and blood vessel injury, DVT, even possibility of heart attack or stroke.  Patient agrees to proceed with surgery and consent was obtained.  Operative Findings: Cephalomedullary nailing of left femur fracture using [employer] FRN 380x70mm nail  Procedure: The patient was identified in the preoperative holding area. Consent was confirmed  with the patient and the family and all questions were answered. The operative extremity was marked and the patient was then brought back to the operating room by our anesthesia colleagues and placed under general anesthesia. She was carefully transferred to a radiolucent flat-top table. A bump was placed and secured under the operative extremity. Contralateral films were obtained for a rotational profile of the femur using an AP of the knee with the patella in the center and the profile of the lesser trochanter. The operative extremity was then prepped and draped in sterile fashion. A pre incision timeout was performed to verify the patient, the procedure and the extremity. Preoperative antibiotics were dosed.   The hip and knee were flexed over a triangle. AP and lateral view were obtained to identify a correct incision and the skin and subcutaneous fat were incised above the greater trochanter. A curved mayo scissors was used to spread inline with the hip abductors to the piriformis fossa. A threaded guidepin was used to identify the correct starting point. AP and lateral views were used to advance the pin to just below the lesser trochanter. A entry reamer was used to enter the canal and the guidepin and reamer were removed. The protection tube was placed in the entry hole and seated in the proximal segment. This assisted with manipulation of the proximal segment.  An incision was made over the fracture to be able to place a clamp to manipulate reduction.  I was able to manipulate the reduction into a near anatomic position.  I then placed a ball-tipped guidewire down the canal. The ball-tip guidewire was eventually passed in the distal segment. AP and lateral fluoro was used to  make sure the path of the guidewire was center-center in the distal part of the femur. This was seated down into the physeal scar. The length of the nail was measured and we chose a 324mm nail. The fracture was held reduced and I  sequentially reamed from a 8.75mm to 21mm with adequate chatter at the end. I was careful with reaming across the fracture to prevent any comminution. The nail was then passed down the center of the canal.  The nail was seated until it was flush with the piriformis entry. The targeting device for femoral recon screws.  I drilled and placed these without difficulty I checked the morning AP and lateral views making sure I did not penetrate the head.  Once we had the proximal segment locked to the nail we focused on rotation and alignment. The cortices lined up anatomic. Using perfect circle technique, two distal interlocks were placed in the nail.  The insertion handle was removed an final x-rays were obtained. The incisions were then irrigated and closed with 2-0 vicryl and 3-0 monocryl. This incisions were sealed with dermabond. The drapes were broken down and an ACE wrap was placed over the RLE after the incisions were dressed with mepilex. Rotation was checked clinically compared to the contralateral side and was nearly identical. The knee was examined and there was no instability about the knee. The patient was then awoken from anesthesia, transferred to his floor bed and taken to the PACU in stable condition.  Post Op Plan/Instructions: Patient will be weightbearing as tolerated to the left lower extremity.  She will receive postoperative Ancef.  She will be placed on Plavix on postoperative day 1.  She will mobilize with physical therapy.  I was present and performed the entire surgery.  Patrecia Pace, PA-C did assist me throughout the case. An assistant was necessary given the difficulty in approach, maintenance of reduction and ability to instrument the fracture.   Katha Hamming, MD [facility]

## 2018-05-06 NOTE — Progress Notes (Signed)
PROGRESS NOTE    Laura Cabrera  ELF:[id number] DOB: 1925-10-15 DOA: 05/05/2018 PCP: Dineen Kid, MD    Brief Narrative:  85 year old female who presented with left leg pain after mechanical fall.  She does have significant past medical history for hypertension, dyslipidemia, history of CVA, ascending thoracic aortic aneurysm and chronic kidney disease stage III.  On the day of admission she sustained a mechanical fall from her own height, no loss of consciousness or head trauma.  After the fall she was unable to bear weight due to significant pain.  On the initial physical examination her blood pressure was 158/50, heart rate 62, respiratory rate 16, oxygen saturation 96%, moist mucous membranes, lungs clear to auscultation bilaterally, heart S1-S2 present and rhythmic abdomen soft, no lower extremity edema, range of motion limited of the left lower extremity due to pain, her left foot was everted.  Sodium 136, potassium 4.3, chloride 101, bicarb 24, glucose 139, BUN 20, creatinine 1.2, white count 12.2, hemoglobin 9.9, hematocrit 31.9, platelets 130.  Urine analysis negative for infection.  Head and cervical spine CT with no acute changes.  Her chest x-ray had cardiomegaly with prominent aortic notch.  Femur x-rays with displaced angulated proximal femoral shaft fracture.  EKG had 60 bpm, normal axis normal intervals, sinus rhythm, right bundle branch block, no significant ST segment or T wave changes.  Patient was admitted to the hospital with a working diagnosis of acute displaced angulated proximal femoral shaft fracture.    Assessment & Plan:   Principal Problem:   Closed displaced oblique fracture of shaft of femur ([place]) Active Problems:   Hypertension   History of stroke   Thoracic ascending aortic aneurysm (HCC)   CKD (chronic kidney disease), stage III ([place])   1. Acute left femoral proximal shaft fracture. Patient is sp orthopedic procedure, examined in recovery room. She seems  to be confused but not in pain. Will continue pain control, dvt prophylaxis and follow recommendations from orthopedic surgery and physical therapy.   2. HTN. Stable blood pressure with systolic 585 mmHg.   3, CKD stage 3. Stable renal function with serum cr at 1,20 with K at 4.0 and serum bicarbonate at 24.   5. Ambulatory dysfunction. Continue to follow recommendations from physical therapy.     DVT prophylaxis: enoxaparin   Code Status:  full Family Communication: no family at the bedside  Disposition Plan/ discharge barriers: pending final orthopedic surgery recommendations.   Body mass index is 20.32 kg/m. Malnutrition Type:      Malnutrition Characteristics:      Nutrition Interventions:     RN Pressure Injury Documentation:     Consultants:   Orthopedics   Procedures:   ORIF  Antimicrobials:      Subjective: Patient is not much interactive, examined in recovery room. Not in apparent pain or dyspnea.   Objective: Vitals:   05/06/18 0100 05/06/18 0141 05/06/18 0341 05/06/18 0902  BP: 133/71 (!) 158/73 (!) 148/81 (!) 145/66  Pulse: 62 77 64 68  Resp: 20 14 14 17   Temp:  98 F (36.7 C) 98.4 F (36.9 C) 98.3 F (36.8 C)  TempSrc:  Oral Oral Oral  SpO2: (!) 88%  97% 98%  Weight:   52 kg   Height:   5\' 3"  (1.6 m)     Intake/Output Summary (Last 24 hours) at 05/06/2018 1210 Last data filed at 05/06/2018 0900 Gross per 24 hour  Intake 289.48 ml  Output -  Net 289.48 ml  Filed Weights   05/06/18 0341  Weight: 52 kg    Examination: In recovery room post op.   General: Not in pain or dyspnea Neurology: moving all four extremities but not following commands or answering to questions.  E ENT: mild pallor, no icterus, oral mucosa moist Cardiovascular: No JVD. S1-S2 present, rhythmic, no gallops, rubs, or murmurs. Trace lower extremity edema. Pulmonary: positive breath sounds bilaterally, adequate air movement, no wheezing, rhonchi or rales.  Gastrointestinal. Abdomen with no organomegaly, non tender, no rebound or guarding Skin. No rashes Musculoskeletal: no joint deformities/ Left femur with dressing in place.      Data Reviewed: I have personally reviewed following labs and imaging studies  CBC: Recent Labs  Lab 05/05/18 2338 05/06/18 0348  WBC 12.2* 9.9  NEUTROABS 10.4*  --   HGB 9.9* 9.6*  HCT 31.9* 29.1*  MCV 99.7 97.7  PLT 130* 562*   Basic Metabolic Panel: Recent Labs  Lab 05/05/18 2338 05/06/18 0348  NA 136 135  K 4.3 4.0  CL 101 103  CO2 24 24  GLUCOSE 139* 159*  BUN 20 20  CREATININE 1.22* 1.20*  CALCIUM 9.1 8.8*   GFR: Estimated Creatinine Clearance: 25.1 mL/min (A) (by C-G formula based on SCr of 1.2 mg/dL (H)). Liver Function Tests: Recent Labs  Lab 05/05/18 2338  AST 40  ALT 13  ALKPHOS 65  BILITOT 0.9  PROT 6.3*  ALBUMIN 3.3*   No results for input(s): LIPASE, AMYLASE in the last 168 hours. No results for input(s): AMMONIA in the last 168 hours. Coagulation Profile: No results for input(s): INR, PROTIME in the last 168 hours. Cardiac Enzymes: Recent Labs  Lab 05/05/18 2338  CKTOTAL 168   BNP (last 3 results) No results for input(s): PROBNP in the last 8760 hours. HbA1C: No results for input(s): HGBA1C in the last 72 hours. CBG: No results for input(s): GLUCAP in the last 168 hours. Lipid Profile: No results for input(s): CHOL, HDL, LDLCALC, TRIG, CHOLHDL, LDLDIRECT in the last 72 hours. Thyroid Function Tests: No results for input(s): TSH, T4TOTAL, FREET4, T3FREE, THYROIDAB in the last 72 hours. Anemia Panel: No results for input(s): VITAMINB12, FOLATE, FERRITIN, TIBC, IRON, RETICCTPCT in the last 72 hours.    Radiology Studies: I have reviewed all of the imaging during this hospital visit personally     Scheduled Meds: . [MAR Hold] latanoprost  1 drop Both Eyes QHS  . [MAR Hold] propranolol  40 mg Oral Daily  . [MAR Hold] sodium chloride flush  3 mL  Intravenous Q12H   Continuous Infusions: . ceFAZolin    . lactated ringers 10 mL/hr at 05/06/18 1149     LOS: 0 days        Dorean Daniello Gerome Apley, MD

## 2018-05-06 NOTE — Progress Notes (Signed)
Returned 1 ring and a pair of earrings and hearing aids to Pt.

## 2018-05-07 LAB — CBC WITH DIFFERENTIAL/PLATELET
Abs Immature Granulocytes: 0.05 10*3/uL (ref 0.00–0.07)
Basophils Absolute: 0 10*3/uL (ref 0.0–0.1)
Basophils Relative: 0 %
Eosinophils Absolute: 0 10*3/uL (ref 0.0–0.5)
Eosinophils Relative: 0 %
HCT: 24.2 % — ABNORMAL LOW (ref 36.0–46.0)
Hemoglobin: 7.7 g/dL — ABNORMAL LOW (ref 12.0–15.0)
Immature Granulocytes: 1 %
LYMPHS ABS: 0.9 10*3/uL (ref 0.7–4.0)
Lymphocytes Relative: 11 %
MCH: 30.6 pg (ref 26.0–34.0)
MCHC: 31.8 g/dL (ref 30.0–36.0)
MCV: 96 fL (ref 80.0–100.0)
MONOS PCT: 9 %
Monocytes Absolute: 0.7 10*3/uL (ref 0.1–1.0)
Neutro Abs: 6.3 10*3/uL (ref 1.7–7.7)
Neutrophils Relative %: 79 %
Platelets: 101 10*3/uL — ABNORMAL LOW (ref 150–400)
RBC: 2.52 MIL/uL — ABNORMAL LOW (ref 3.87–5.11)
RDW: 13.3 % (ref 11.5–15.5)
WBC: 7.9 10*3/uL (ref 4.0–10.5)
nRBC: 0 % (ref 0.0–0.2)

## 2018-05-07 LAB — BASIC METABOLIC PANEL
Anion gap: 9 (ref 5–15)
BUN: 23 mg/dL (ref 8–23)
CALCIUM: 8.3 mg/dL — AB (ref 8.9–10.3)
CO2: 24 mmol/L (ref 22–32)
Chloride: 100 mmol/L (ref 98–111)
Creatinine, Ser: 1.22 mg/dL — ABNORMAL HIGH (ref 0.44–1.00)
GFR calc Af Amer: 45 mL/min — ABNORMAL LOW (ref >60)
GFR calc non Af Amer: 39 mL/min — ABNORMAL LOW (ref >60)
Glucose, Bld: 149 mg/dL — ABNORMAL HIGH (ref 70–99)
Potassium: 4.3 mmol/L (ref 3.5–5.1)
Sodium: 133 mmol/L — ABNORMAL LOW (ref 135–145)

## 2018-05-07 NOTE — Anesthesia Postprocedure Evaluation (Signed)
Anesthesia Post Note  Patient: ALEXANDRINA FIORINI  Procedure(s) Performed: INTRAMEDULLARY (IM) NAIL INTERTROCHANTRIC (N/A )     Patient location during evaluation: PACU Anesthesia Type: General Level of consciousness: awake and alert Pain management: pain level controlled Vital Signs Assessment: post-procedure vital signs reviewed and stable Respiratory status: spontaneous breathing, nonlabored ventilation, respiratory function stable and patient connected to nasal cannula oxygen Cardiovascular status: blood pressure returned to baseline and stable Postop Assessment: no apparent nausea or vomiting Anesthetic complications: no    Last Vitals:  Vitals:   05/07/18 0419 05/07/18 1931  BP: (!) 147/86 (!) 147/79  Pulse: (!) 114 63  Resp: 14 14  Temp: 37.1 C 36.8 C  SpO2: 94% 96%    Last Pain:  Vitals:   05/07/18 2000  TempSrc:   PainSc: 10-Worst pain ever                 Shernell Saldierna

## 2018-05-07 NOTE — Progress Notes (Signed)
Occupational Therapy Evaluation Patient Details Name: Laura Cabrera MRN: [id number] DOB: 1925/05/13 Today's Date: 05/07/2018    History of Present Illness Laura Cabrera is a 85 y.o. female with medical history significant for hypertension, hyperlipidemia, history of stroke, CKD stage III, and ascending thoracic aortic aneurysm who presents to the hospital after sustaining L femur fx after a fall at home.   Clinical Impression   PTA pt PLOF Mod I in all ADLs with increased time and use of AE. Pt reports living at home alone with access to daughter who lives near. Pt currently requires Mod A +2 with functional transfer, Mod A LB ADLs, and set up for oral hygiene and grooming. Pt will benefit from continued skilled acute OT to maximize independence and prepare for safe transition to DC environment. DC to SNF to ensure education is given for compensatory and safety awareness due to living alone. OT will continue to follow acutely.     Follow Up Recommendations  Follow surgeon's recommendation for DC plan and follow-up therapies;SNF;Supervision/Assistance - 24 hour    Equipment Recommendations  3 in 1 bedside commode    Recommendations for Other Services       Precautions / Restrictions Restrictions Weight Bearing Restrictions: Yes LLE Weight Bearing: Weight bearing as tolerated      Mobility Bed Mobility Overal bed mobility: Needs Assistance Bed Mobility: Supine to Sit     Supine to sit: Min assist;HOB elevated     General bed mobility comments: Pt able to power up to sit with increased time. Min A to assist LLE to sit at EOB with bed pad.   Transfers Overall transfer level: Needs assistance Equipment used: Rolling walker (2 wheeled) Transfers: Sit to/from Stand Sit to Stand: Mod assist;+2 physical assistance;+2 safety/equipment         General transfer comment: Mod A +2 for safety and sequencing to sit at EOB.     Balance Overall balance assessment: Needs  assistance Sitting-balance support: Bilateral upper extremity supported;Feet supported Sitting balance-Leahy Scale: Good     Standing balance support: Bilateral upper extremity supported Standing balance-Leahy Scale: Poor Standing balance comment: Reliant of UE support of RW.                           ADL either performed or assessed with clinical judgement   ADL Overall ADL's : Needs assistance/impaired Eating/Feeding: Set up;Sitting   Grooming: Wash/dry hands;Wash/dry face;Brushing hair;Set up;Sitting           Upper Body Dressing : Modified independent Upper Body Dressing Details (indicate cue type and reason): with increased time Lower Body Dressing: Sitting/lateral leans;Moderate assistance   Toilet Transfer: Moderate assistance;+2 for physical assistance;+2 for safety/equipment;Cueing for safety;Cueing for sequencing;Ambulation;RW Toilet Transfer Details (indicate cue type and reason): simulated toilet transfer from bed to recliner.         Functional mobility during ADLs: Moderate assistance;+2 for physical assistance;+2 for safety/equipment;Cueing for safety;Cueing for sequencing;Rolling walker General ADL Comments: Pt required assist for safety. Mod A and VCs for hand placement and sequencing.      Vision         Perception     Praxis      Pertinent Vitals/Pain Pain Assessment: 0-10 Pain Score: 2  Pain Location: L hip Pain Descriptors / Indicators: Aching Pain Intervention(s): Monitored during session;Repositioned     Hand Dominance     Extremity/Trunk Assessment Upper Extremity Assessment Upper Extremity Assessment: Generalized weakness  Lower Extremity Assessment Lower Extremity Assessment: Defer to PT evaluation       Communication Communication Communication: HOH   Cognition Arousal/Alertness: Awake/alert Behavior During Therapy: WFL for tasks assessed/performed Overall Cognitive Status: Within Functional Limits for tasks  assessed                                     General Comments       Exercises     Shoulder Instructions      Home Living Family/patient expects to be discharged to:: Private residence Living Arrangements: Alone Available Help at Discharge: Family Type of Home: House Home Access: Stairs to enter Technical brewer of Steps: 3   Home Layout: One level     Bathroom Shower/Tub: Occupational psychologist: Standard     Home Equipment: Environmental consultant - 2 wheels;Cane - quad;Shower seat;Adaptive equipment Adaptive Equipment: Reacher        Prior Functioning/Environment Level of Independence: Independent with assistive device(s)        Comments: Pt reports being Independent and active PTA.        OT Problem List: Decreased strength;Impaired balance (sitting and/or standing);Decreased safety awareness;Decreased knowledge of use of DME or AE;Pain      OT Treatment/Interventions: Self-care/ADL training;Therapeutic exercise;DME and/or AE instruction;Therapeutic activities;Patient/family education;Balance training    OT Goals(Current goals can be found in the care plan section) Acute Rehab OT Goals Patient Stated Goal: To return home OT Goal Formulation: With patient Time For Goal Achievement: 05/21/18 Potential to Achieve Goals: Good  OT Frequency: Min 2X/week   Barriers to D/C: Decreased caregiver support  Reports having daughter not sure if available 24 hours.        Co-evaluation              AM-PAC OT "6 Clicks" Daily Activity     Outcome Measure Help from another person eating meals?: None Help from another person taking care of personal grooming?: A Little Help from another person toileting, which includes using toliet, bedpan, or urinal?: A Lot Help from another person bathing (including washing, rinsing, drying)?: A Little Help from another person to put on and taking off regular upper body clothing?: None Help from another person  to put on and taking off regular lower body clothing?: A Lot 6 Click Score: 18   End of Session Equipment Utilized During Treatment: Gait belt;Rolling walker Nurse Communication: Mobility status;Weight bearing status  Activity Tolerance: Patient tolerated treatment well Patient left: in chair;with call bell/phone within reach;with chair alarm set  OT Visit Diagnosis: Unsteadiness on feet (R26.81);History of falling (Z91.81);Pain Pain - Right/Left: Left Pain - part of body: Hip                Time: 8099-8338 OT Time Calculation (min): 37 min Charges:  OT General Charges $OT Visit: 1 Visit OT Evaluation $OT Eval Moderate Complexity: 1 Mod OT Treatments $Self Care/Home Management : 8-22 mins  Minus Breeding, MSOT, OTR/L  Supplemental Rehabilitation Services  [phone]  Marius Ditch 05/07/2018, 12:37 PM

## 2018-05-07 NOTE — NC FL2 (Signed)
[place] LEVEL OF CARE SCREENING TOOL     IDENTIFICATION  Patient Name: Laura Cabrera Birthdate: [birth year] Sex: female Admission Date (Current Location): 05/05/2018  [facility] and [place] Number:  Herbalist and Address:  [facility], [place] [address] [place], [place] [address]      Provider Number: [id number]  Attending Physician Name and Address:  Tawni Millers,*  Relative Name and Phone Number:  Zigmund Daniel (daughter )[phone]    Current Level of Care: Hospital Recommended Level of Care: [place] Prior Approval Number:    Date Approved/Denied: 04/04/17 PASRR Number: [id number]  Discharge Plan: SNF    Current Diagnoses: Patient Active Problem List   Diagnosis Date Noted  . Closed displaced oblique fracture of shaft of femur ([place]) 05/06/2018  . Hypertension 05/06/2018  . History of stroke 05/06/2018  . Thoracic ascending aortic aneurysm ([place]) 05/06/2018  . CKD (chronic kidney disease), stage III ([place]) 05/06/2018    Orientation RESPIRATION BLADDER Height & Weight     Self, Time, Place, Situation  Normal Continent, External catheter Weight: 114 lb 11.2 oz (52 kg) Height:  5\' 3"  (160 cm)  BEHAVIORAL SYMPTOMS/MOOD NEUROLOGICAL BOWEL NUTRITION STATUS      Continent Diet(see discharge summary)  AMBULATORY STATUS COMMUNICATION OF NEEDS Skin   Limited Assist Verbally Surgical wounds(left leg closed surgical incision)                       Personal Care Assistance Level of Assistance  Bathing, Feeding, Dressing, Total care Bathing Assistance: Limited assistance Feeding assistance: Independent Dressing Assistance: Limited assistance Total Care Assistance: Limited assistance   Functional Limitations Info  Sight, Hearing, Speech Sight Info: Adequate Hearing Info: Impaired(hearing aids both ears) Speech Info: Adequate    SPECIAL CARE FACTORS FREQUENCY  PT (By licensed PT), OT (By licensed  OT)     PT Frequency: min 5x weekly OT Frequency: min 5x weekly            Contractures Contractures Info: Not present    Additional Factors Info  Code Status, Allergies Code Status Info: full Allergies Info: Penicillins, Sulfamethoxazole, Penicillin G           Current Medications (05/07/2018):  This is the current hospital active medication list Current Facility-Administered Medications  Medication Dose Route Frequency Provider Last Rate Last Dose  . acetaminophen (TYLENOL) tablet 650 mg  650 mg Oral Q6H Delray Alt, PA-C   650 mg at 05/07/18 9417  . clopidogrel (PLAVIX) tablet 75 mg  75 mg Oral Daily Patrecia Pace A, PA-C   75 mg at 05/07/18 4081  . HYDROmorphone (DILAUDID) injection 0.5 mg  0.5 mg Intravenous Q3H PRN Patrecia Pace A, PA-C      . lactated ringers infusion   Intravenous Continuous Delray Alt, PA-C 10 mL/hr at 05/06/18 1149    . latanoprost (XALATAN) 0.005 % ophthalmic solution 1 drop  1 drop Both Eyes QHS Delray Alt, PA-C   1 drop at 05/06/18 2157  . methocarbamol (ROBAXIN) tablet 500 mg  500 mg Oral Q6H PRN Delray Alt, PA-C      . ondansetron [facility]) tablet 4 mg  4 mg Oral Q6H PRN Patrecia Pace A, PA-C       Or  . ondansetron [facility]) injection 4 mg  4 mg Intravenous Q6H PRN Patrecia Pace A, PA-C      . oxyCODONE (Oxy IR/ROXICODONE) immediate release tablet 5-10 mg  5-10 mg Oral Q4H PRN Patrecia Pace A, PA-C      . polyvinyl alcohol (LIQUIFILM TEARS) 1.4 % ophthalmic solution 1 drop  1 drop Both Eyes PRN Arrien, Jimmy Picket, MD   1 drop at 05/06/18 1815  . propranolol (INDERAL) tablet 40 mg  40 mg Oral Daily Patrecia Pace A, PA-C   40 mg at 05/07/18 5800  . senna-docusate (Senokot-S) tablet 1 tablet  1 tablet Oral QHS PRN Patrecia Pace A, PA-C      . sodium chloride flush (NS) 0.9 % injection 3 mL  3 mL Intravenous Q12H Delray Alt, PA-C         Discharge Medications: Please see discharge summary for a list of discharge  medications.  Relevant Imaging Results:  Relevant Lab Results:   Additional Information SSN: [id number]  Alberteen Sam, LCSW

## 2018-05-07 NOTE — Progress Notes (Signed)
PROGRESS NOTE    Laura Cabrera  TSV:[id number] DOB: September 28, 1925 DOA: 05/05/2018 PCP: Dineen Kid, MD    Brief Narrative:  85 year old female who presented with left leg pain after mechanical fall.  She does have significant past medical history for hypertension, dyslipidemia, history of CVA, ascending thoracic aortic aneurysm and chronic kidney disease stage III.  On the day of admission she sustained a mechanical fall from her own height, no loss of consciousness or head trauma.  After the fall she was unable to bear weight due to significant pain.  On the initial physical examination her blood pressure was 158/50, heart rate 62, respiratory rate 16, oxygen saturation 96%, moist mucous membranes, lungs clear to auscultation bilaterally, heart S1-S2 present and rhythmic abdomen soft, no lower extremity edema, range of motion limited of the left lower extremity due to pain, her left foot was everted.  Sodium 136, potassium 4.3, chloride 101, bicarb 24, glucose 139, BUN 20, creatinine 1.2, white count 12.2, hemoglobin 9.9, hematocrit 31.9, platelets 130.  Urine analysis negative for infection.  Head and cervical spine CT with no acute changes.  Her chest x-ray had cardiomegaly with prominent aortic notch.  Femur x-rays with displaced angulated proximal femoral shaft fracture.  EKG had 60 bpm, normal axis normal intervals, sinus rhythm, right bundle branch block, no significant ST segment or T wave changes.  Patient was admitted to the hospital with a working diagnosis of acute displaced angulated proximal femoral shaft fracture.     Assessment & Plan:   Principal Problem:   Closed displaced oblique fracture of shaft of femur ([place]) Active Problems:   Hypertension   History of stroke   Thoracic ascending aortic aneurysm (HCC)   CKD (chronic kidney disease), stage III ([place])   1. Acute left femoral proximal shaft fracture. Patient complains of pain at the surgical wound, that is controlled  with analgesics, continue as needed oxycodone and hydromorphone. Follow with physical therapy and orthopedic surgery recommendations. Continue DVT prophylaxis.   2. HTN. Continue blood pressure monitoring. On propranolol 40 mg daily.    3, CKD stage 3. Renal function continue to be stable with serum creatine at 1,22 with K at 4,3 and serum bicarbonate at 24.   5. Ambulatory dysfunction. Follow with physical therapy recommendations.   6. Hx of CVA. No new focal deficits, will continue clopidogrel as antiplatelet therapy. Patient not on statin therapy.   DVT prophylaxis: enoxaparin   Code Status:  full Family Communication: no family at the bedside  Disposition Plan/ discharge barriers: pending final orthopedic surgery recommendations.   Body mass index is 20.32 kg/m. Malnutrition Type:      Malnutrition Characteristics:      Nutrition Interventions:     RN Pressure Injury Documentation:     Consultants:   Orthopedics   Procedures:   ORIF left   Antimicrobials:       Subjective: Patient is feeling better, positive pain at the surgical site, controlled with analgesics, no nausea or vomiting, no chest pain or dyspnea.   Objective: Vitals:   05/06/18 1533 05/06/18 1702 05/06/18 2041 05/07/18 0419  BP: (!) 156/126 (!) 149/79 (!) 134/50 (!) 147/86  Pulse: (!) 45 62 62 (!) 114  Resp: 16 16 14 14   Temp: (!) 97.5 F (36.4 C) (!) 97.5 F (36.4 C) 97.7 F (36.5 C) 98.7 F (37.1 C)  TempSrc: Oral Oral Oral Oral  SpO2: (!) 88% 99% 94% 94%  Weight:      Height:  Intake/Output Summary (Last 24 hours) at 05/07/2018 0951 Last data filed at 05/07/2018 0600 Gross per 24 hour  Intake 1766.23 ml  Output 52 ml  Net 1714.23 ml   Filed Weights   05/06/18 0341  Weight: 52 kg    Examination:   General: Not in pain or dyspnea, deconditioned  Neurology: Awake and alert, non focal, decreased hearing  E ENT: mild pallor, no icterus, oral mucosa moist  Cardiovascular: No JVD. S1-S2 present, rhythmic, no gallops, rubs, or murmurs. No lower extremity edema. Pulmonary: positive breath sounds bilaterally, adequate air movement, no wheezing, rhonchi or rales. Gastrointestinal. Abdomen with no organomegaly, non tender, no rebound or guarding Skin. No rashes Musculoskeletal: no joint deformities     Data Reviewed: I have personally reviewed following labs and imaging studies  CBC: Recent Labs  Lab 05/05/18 2338 05/06/18 0348 05/07/18 0350  WBC 12.2* 9.9 7.9  NEUTROABS 10.4*  --  6.3  HGB 9.9* 9.6* 7.7*  HCT 31.9* 29.1* 24.2*  MCV 99.7 97.7 96.0  PLT 130* 117* 992*   Basic Metabolic Panel: Recent Labs  Lab 05/05/18 2338 05/06/18 0348 05/07/18 0350  NA 136 135 133*  K 4.3 4.0 4.3  CL 101 103 100  CO2 24 24 24   GLUCOSE 139* 159* 149*  BUN 20 20 23   CREATININE 1.22* 1.20* 1.22*  CALCIUM 9.1 8.8* 8.3*   GFR: Estimated Creatinine Clearance: 24.7 mL/min (A) (by C-G formula based on SCr of 1.22 mg/dL (H)). Liver Function Tests: Recent Labs  Lab 05/05/18 2338  AST 40  ALT 13  ALKPHOS 65  BILITOT 0.9  PROT 6.3*  ALBUMIN 3.3*   No results for input(s): LIPASE, AMYLASE in the last 168 hours. No results for input(s): AMMONIA in the last 168 hours. Coagulation Profile: No results for input(s): INR, PROTIME in the last 168 hours. Cardiac Enzymes: Recent Labs  Lab 05/05/18 2338  CKTOTAL 168   BNP (last 3 results) No results for input(s): PROBNP in the last 8760 hours. HbA1C: No results for input(s): HGBA1C in the last 72 hours. CBG: No results for input(s): GLUCAP in the last 168 hours. Lipid Profile: No results for input(s): CHOL, HDL, LDLCALC, TRIG, CHOLHDL, LDLDIRECT in the last 72 hours. Thyroid Function Tests: No results for input(s): TSH, T4TOTAL, FREET4, T3FREE, THYROIDAB in the last 72 hours. Anemia Panel: No results for input(s): VITAMINB12, FOLATE, FERRITIN, TIBC, IRON, RETICCTPCT in the last 72 hours.     Radiology Studies: I have reviewed all of the imaging during this hospital visit personally     Scheduled Meds: . acetaminophen  650 mg Oral Q6H  . clopidogrel  75 mg Oral Daily  . latanoprost  1 drop Both Eyes QHS  . propranolol  40 mg Oral Daily  . sodium chloride flush  3 mL Intravenous Q12H   Continuous Infusions: . lactated ringers 10 mL/hr at 05/06/18 1149     LOS: 1 day        Mauricio Gerome Apley, MD

## 2018-05-07 NOTE — Progress Notes (Addendum)
Orthopaedic Trauma Progress Note  S: Doing well this AM, pain well controlled. Was able to get up to bedside commode yesterday without significant discomfort. Plans to work with therapies today.   O:  Vitals:   05/06/18 2041 05/07/18 0419  BP: (!) 134/50 (!) 147/86  Pulse: 62 (!) 114  Resp: 14 14  Temp: 97.7 F (36.5 C) 98.7 F (37.1 C)  SpO2: 94% 94%   General - Resting in bed comfortably, NAD, alert and oriented Cardiac - heart regular rate and rhtyhm Lungs - No increased work of breathing. CTA anterior lung fields bilaterally LLE - dressings clean, dry, intact. Minimal swelling of thigh or lower leg. Mild tenderness to palpation of thigh. Minimal tenderness to palpation of hip, knee, or lower leg. Knee ROM without significant discomfort. Full ankle ROM. Sensory and motor function intact. Neurovascularly intact   Imaging: stable post op imaging  Labs:  Results for orders placed or performed during the hospital encounter of 05/05/18 (from the past 24 hour(s))  CBC with Differential/Platelet     Status: Abnormal   Collection Time: 05/07/18  3:50 AM  Result Value Ref Range   WBC 7.9 4.0 - 10.5 K/uL   RBC 2.52 (L) 3.87 - 5.11 MIL/uL   Hemoglobin 7.7 (L) 12.0 - 15.0 g/dL   HCT 24.2 (L) 36.0 - 46.0 %   MCV 96.0 80.0 - 100.0 fL   MCH 30.6 26.0 - 34.0 pg   MCHC 31.8 30.0 - 36.0 g/dL   RDW 13.3 11.5 - 15.5 %   Platelets 101 (L) 150 - 400 K/uL   nRBC 0.0 0.0 - 0.2 %   Neutrophils Relative % 79 %   Neutro Abs 6.3 1.7 - 7.7 K/uL   Lymphocytes Relative 11 %   Lymphs Abs 0.9 0.7 - 4.0 K/uL   Monocytes Relative 9 %   Monocytes Absolute 0.7 0.1 - 1.0 K/uL   Eosinophils Relative 0 %   Eosinophils Absolute 0.0 0.0 - 0.5 K/uL   Basophils Relative 0 %   Basophils Absolute 0.0 0.0 - 0.1 K/uL   Immature Granulocytes 1 %   Abs Immature Granulocytes 0.05 0.00 - 0.07 K/uL  Basic metabolic panel     Status: Abnormal   Collection Time: 05/07/18  3:50 AM  Result Value Ref Range   Sodium  133 (L) 135 - 145 mmol/L   Potassium 4.3 3.5 - 5.1 mmol/L   Chloride 100 98 - 111 mmol/L   CO2 24 22 - 32 mmol/L   Glucose, Bld 149 (H) 70 - 99 mg/dL   BUN 23 8 - 23 mg/dL   Creatinine, Ser 1.22 (H) 0.44 - 1.00 mg/dL   Calcium 8.3 (L) 8.9 - 10.3 mg/dL   GFR calc non Af Amer 39 (L) >60 mL/min   GFR calc Af Amer 45 (L) >60 mL/min   Anion gap 9 5 - 15    Assessment: 85 year old female s/p ground level fall  Injuries: Left femoral shaft fracture s/p intermedullary nailing on 04/07/18  Weightbearing: WBAT LLE   Insicional and dressing care: dressings clean, dry, intact. Will change tomorrow  Orthopedic device(s): none  CV/Blood loss: Acute blood loss anemia, Hgb 7.7 this AM. Hemodynamically stable. Continue to monitor  Pain management: 1. Tylenol 650 mg q 6 hours scheduled 2. Oxycodone 5-10 mg q 4 hours PRN 3. Dilaudid 0.5 mg IV q 3 hours PRN 4. Robaxin 500 mg q 6 hours PRN  VTE prophylaxis: Okay to restart Plavix today from  ortho perspective  ID: Ancef 2gm post op completed  Foley/Lines: No foley, KVO IVFs  Medical co-morbidities: hypertension, hyperlipidemia, history ofstroke, CKD stage III, and ascending thoracic aortic aneurysm   Dispo: PT eval today, dispo pending  Follow - up plan: 2 weeks   Nohelia Valenza A. [facility] ?([phone] (phone)

## 2018-05-07 NOTE — Plan of Care (Signed)

## 2018-05-07 NOTE — Evaluation (Signed)
Physical Therapy Evaluation Patient Details Name: Laura Cabrera MRN: [id number] DOB: 1925-08-14 Today's Date: 05/07/2018   History of Present Illness  CANDRA WEGNER is a 85 y.o. female with medical history significant for hypertension, hyperlipidemia, history of stroke, CKD stage III, and ascending thoracic aortic aneurysm who presents to the hospital after sustaining L femur fx after a fall at home. She had IM nail fixation on 05/05/17. She is now WBAT for Lt LE.    Clinical Impression  Patient had Lt hip IM nail fixation after another fall. She was fatigued and required max A +2 for transfers and ambulation using RW. She will need PT to address her deficits in ROM, strength, endurance, functional mobility and increased pain. PT recommending discharge to SNF at this time.      Follow Up Recommendations SNF    Equipment Recommendations  (to be determined at next venue)    Recommendations for Other Services       Precautions / Restrictions Precautions Precautions: Fall Precaution Comments: repeated falls Restrictions Weight Bearing Restrictions: Yes LLE Weight Bearing: Weight bearing as tolerated      Mobility  Bed Mobility Overal bed mobility: Needs Assistance Bed Mobility: Sit to Supine;Rolling Rolling: Mod assist     Sit to supine: Max assist;+2 for physical assistance(due to fatique at end of session)   General bed mobility comments: fatiqued at end of session requiring max A +2 for bed mobility  Transfers Overall transfer level: Needs assistance Equipment used: Rolling walker (2 wheeled) Transfers: Sit to/from [facility] Sit to Stand: Mod assist;+2 physical assistance         General transfer comment: Mod A +2 for safety and sequencing along with RW managment and encouragment  Ambulation/Gait Ambulation/Gait assistance: Mod assist;+2 physical assistance;+2 safety/equipment Gait Distance (Feet): 5 Feet Assistive device: Rolling walker (2  wheeled) Gait Pattern/deviations: Step-to pattern;Decreased step length - right;Decreased step length - left;Decreased stance time - left;Decreased dorsiflexion - left;Decreased weight shift to left Gait velocity: slower Gait velocity interpretation: <1.31 ft/sec, indicative of household ambulator General Gait Details: difficulty advancing Rt leg and needed physical assistance to advance due to limited WB tolerance on Lt LE  Stairs            Wheelchair Mobility    Modified Rankin (Stroke Patients Only)       Balance Overall balance assessment: Needs assistance   Sitting balance-Leahy Scale: Fair     Standing balance support: Bilateral upper extremity supported Standing balance-Leahy Scale: Poor Standing balance comment: Reliant of UE support of RW.                             Pertinent Vitals/Pain Pain Assessment: 0-10 Pain Score: 2  Pain Location: L hip Pain Descriptors / Indicators: Aching Pain Intervention(s): Limited activity within patient's tolerance;Monitored during session;Repositioned    Home Living Family/patient expects to be discharged to:: Private residence Living Arrangements: Alone Available Help at Discharge: Family Type of Home: House Home Access: Stairs to enter   Technical brewer of Steps: 3 Home Layout: One level Home Equipment: Environmental consultant - 2 wheels;Cane - quad;Shower seat;Adaptive equipment      Prior Function Level of Independence: Independent with assistive device(s)         Comments: Pt reports being Independent and active PTA, using RW.        Extremity/Trunk Assessment   Upper Extremity Assessment Upper Extremity Assessment: Generalized weakness    Lower  Extremity Assessment Lower Extremity Assessment: Generalized weakness;LLE deficits/detail LLE Deficits / Details: Lt leg overall 3/5 grossly tested sit, also limited ROM due to pain LLE: Unable to fully assess due to pain LLE Coordination: decreased gross  motor;decreased fine motor    Cervical / Trunk Assessment Cervical / Trunk Assessment: Kyphotic  Communication   Communication: HOH  Cognition Arousal/Alertness: Awake/alert Behavior During Therapy: WFL for tasks assessed/performed Overall Cognitive Status: Within Functional Limits for tasks assessed                                        General Comments General comments (skin integrity, edema, etc.): bandage clean and dry        Assessment/Plan    PT Assessment Patient needs continued PT services  PT Problem List Decreased strength;Decreased range of motion;Decreased activity tolerance;Decreased balance;Decreased mobility;Decreased knowledge of use of DME;Decreased cognition;Decreased safety awareness;Pain;Decreased coordination       PT Treatment Interventions DME instruction;Gait training;Stair training;Functional mobility training;Therapeutic activities;Therapeutic exercise;Balance training;Neuromuscular re-education;Cognitive remediation;Patient/family education;Manual techniques;Modalities    PT Goals (Current goals can be found in the Care Plan section)  Acute Rehab PT Goals Patient Stated Goal: decrease pain PT Goal Formulation: With patient Time For Goal Achievement: 05/21/18 Potential to Achieve Goals: Fair    Frequency Min 5X/week   Barriers to discharge Decreased caregiver support she lives alone, PT unsure about how much family support available    AM-PAC PT "6 Clicks" Mobility  Outcome Measure Help needed turning from your back to your side while in a flat bed without using bedrails?: A Lot Help needed moving from lying on your back to sitting on the side of a flat bed without using bedrails?: A Lot Help needed moving to and from a bed to a chair (including a wheelchair)?: A Lot Help needed standing up from a chair using your arms (e.g., wheelchair or bedside chair)?: A Lot Help needed to walk in hospital room?: Total Help needed climbing  3-5 steps with a railing? : Total 6 Click Score: 10    End of Session Equipment Utilized During Treatment: Gait belt Activity Tolerance: Patient limited by fatigue;Patient limited by pain Patient left: in bed;with call bell/phone within reach;with bed alarm set(notified nursing about needed one more SCD) Nurse Communication: Mobility status PT Visit Diagnosis: Unsteadiness on feet (R26.81);Other abnormalities of gait and mobility (R26.89);Repeated falls (R29.6);Muscle weakness (generalized) (M62.81);History of falling (Z91.81);Difficulty in walking, not elsewhere classified (R26.2);Pain Pain - Right/Left: Left Pain - part of body: Hip    Time: 7543-6067 PT Time Calculation (min) (ACUTE ONLY): 29 min   Charges:   PT Evaluation $PT Eval Moderate Complexity: 1 Mod PT Treatments $Therapeutic Activity: 8-22 mins        Elsie Ra, PT,DPT Acute Rehabilitation Services Office [phone]   Debbe Odea 05/07/2018, 4:43 PM

## 2018-05-08 ENCOUNTER — Encounter (HOSPITAL_COMMUNITY): Payer: Self-pay | Admitting: Student

## 2018-05-08 DIAGNOSIS — D649 Anemia, unspecified: Secondary | ICD-10-CM

## 2018-05-08 LAB — CBC
HCT: 21.5 % — ABNORMAL LOW (ref 36.0–46.0)
Hemoglobin: 7 g/dL — ABNORMAL LOW (ref 12.0–15.0)
MCH: 31.8 pg (ref 26.0–34.0)
MCHC: 32.6 g/dL (ref 30.0–36.0)
MCV: 97.7 fL (ref 80.0–100.0)
Platelets: 96 10*3/uL — ABNORMAL LOW (ref 150–400)
RBC: 2.2 MIL/uL — ABNORMAL LOW (ref 3.87–5.11)
RDW: 13.8 % (ref 11.5–15.5)
WBC: 9.6 10*3/uL (ref 4.0–10.5)
nRBC: 0 % (ref 0.0–0.2)

## 2018-05-08 MED ORDER — OXYCODONE HCL 5 MG PO TABS: 5.0000 mg | ORAL_TABLET | Freq: Four times a day (QID) | ORAL | 0 refills | Status: DC | PRN

## 2018-05-08 NOTE — Progress Notes (Signed)
Orthopaedic Trauma Progress Note  S: Doing well this AM, pain well controlled. Was able to work with therapies yesterday, they are recommending SNF for continued therapy to address her deficits in ROM, strength, endurance, and functional mobility. Patient is amenable to this. Denies any numbness or tingling in the leg this morning.   O:  Vitals:   05/07/18 1931 05/08/18 0415  BP: (!) 147/79 (!) 148/69  Pulse: 63 60  Resp: 14 16  Temp: 98.3 F (36.8 C) 98.6 F (37 C)  SpO2: 96% 94%   General - Resting in bed comfortably, NAD, alert and oriented Cardiac - heart regular rate and rhtyhm Lungs - No increased work of breathing. CTA anterior lung fields bilaterally LLE - Dressings removed. Incisions clean, dry, intact. Minimal swelling of thigh or lower leg. Mild tenderness to palpation of thigh. Minimal tenderness to palpation of hip, knee, or lower leg. Knee ROM without significant discomfort. Full ankle ROM. Sensory and motor function intact. Neurovascularly intact   Imaging: stable post op imaging  Labs:  No results found for this or any previous visit (from the past 24 hour(s)).  Assessment: 85 year old female s/p ground level fall  Injuries: Left femoral shaft fracture s/p intermedullary nailing on 04/07/18  Weightbearing: WBAT LLE   Insicional and dressing care: incisions clean, dry, intact. Change PRN Orthopedic device(s): none  CV/Blood loss: Acute blood loss anemia. CBC pending this AM, Hgb 7.7 yesterday. Hemodynamically stable. Continue to monitor  Pain management: 1. Tylenol 650 mg q 6 hours scheduled 2. Oxycodone 5-10 mg q 4 hours PRN 3. Dilaudid 0.5 mg IV q 3 hours PRN 4. Robaxin 500 mg q 6 hours PRN  VTE prophylaxis: Okay to restart Plavix today from ortho perspective  ID: Ancef 2gm post op completed  Foley/Lines: No foley, KVO IVFs  Medical co-morbidities: hypertension, hyperlipidemia, history ofstroke, CKD stage III, and ascending thoracic aortic  aneurysm   Dispo: PT eval today, recommending SNF. Patient okay to be discharged from orthopaedic perspective  Follow - up plan: 2 weeks   Deliyah Muckle A. [facility] ?[phone]? (phone)

## 2018-05-08 NOTE — Progress Notes (Signed)
Report called to [facility]. All questions answered. All belongings gathered to be sent with her.

## 2018-05-08 NOTE — Progress Notes (Signed)
Physical Therapy Treatment Patient Details Name: Laura Cabrera MRN: [id number] DOB: 09-03-1925 Today's Date: 05/08/2018    History of Present Illness Laura Cabrera is a 85 y.o. female with medical history significant for hypertension, hyperlipidemia, history of stroke, CKD stage III, and ascending thoracic aortic aneurysm who presents to the hospital after sustaining L femur fx after a fall at home. She had IM nail fixation on 05/05/17. She is now WBAT for Lt LE.    PT Comments    Patient is agreeable to get up to the chair, however is limited in safe mobility by increased pain and decreased safety awareness. Pt requires maxAx2 for bed mobility and modAx2 for transfers and ambulation of 2 feet with RW. Pt is scheduled to d/c to SNF this afternoon.     Follow Up Recommendations  SNF     Equipment Recommendations  (to be determined at next venue)       Precautions / Restrictions Precautions Precautions: Fall Precaution Comments: repeated falls Restrictions Weight Bearing Restrictions: Yes LLE Weight Bearing: Weight bearing as tolerated    Mobility  Bed Mobility Overal bed mobility: Needs Assistance Bed Mobility: Sit to Supine;Rolling     Supine to sit: Max assist;+2 for physical assistance     General bed mobility comments: maxA x2 for management of LE off bed, pt reached towards bed rail to help bring hips around and trunk to upright, however missed bed rail and fell backward requiring maxAx to catch her and bring her trunk to upright   Transfers Overall transfer level: Needs assistance Equipment used: Rolling walker (2 wheeled) Transfers: Sit to/from [facility] Sit to Stand: Mod assist;+2 physical assistance         General transfer comment: Mod A +2 for safety and sequencing, pt reqiores increased time and effort to come to upright, with only mild instability  Ambulation/Gait Ambulation/Gait assistance: Mod assist;+2 physical assistance;+2  safety/equipment Gait Distance (Feet): 2 Feet Assistive device: Rolling walker (2 wheeled) Gait Pattern/deviations: Step-to pattern;Decreased step length - right;Decreased step length - left;Decreased stance time - left;Decreased dorsiflexion - left;Decreased weight shift to left Gait velocity: slower Gait velocity interpretation: <1.31 ft/sec, indicative of household ambulator General Gait Details: difficulty advancing Rt leg, max vc for use of UE and offweighting heel to pivot towards recliner          Balance Overall balance assessment: Needs assistance   Sitting balance-Leahy Scale: Fair     Standing balance support: Bilateral upper extremity supported Standing balance-Leahy Scale: Poor Standing balance comment: Reliant of UE support of RW.                            Cognition Arousal/Alertness: Awake/alert Behavior During Therapy: WFL for tasks assessed/performed Overall Cognitive Status: Within Functional Limits for tasks assessed                                           General Comments General comments (skin integrity, edema, etc.): dressings removed and pt rubbing insicion, pt educated to not touch actual inscision to reduce risk for infection      Pertinent Vitals/Pain Pain Assessment: Faces Faces Pain Scale: Hurts even more Pain Location: L hip Pain Descriptors / Indicators: Grimacing;Guarding Pain Intervention(s): Limited activity within patient's tolerance;Monitored during session;Repositioned           PT  Goals (current goals can now be found in the care plan section) Acute Rehab PT Goals Patient Stated Goal: decrease pain PT Goal Formulation: With patient Time For Goal Achievement: 05/21/18 Potential to Achieve Goals: Fair Progress towards PT goals: Progressing toward goals    Frequency    Min 5X/week      PT Plan Current plan remains appropriate       AM-PAC PT "6 Clicks" Mobility   Outcome Measure  Help  needed turning from your back to your side while in a flat bed without using bedrails?: A Lot Help needed moving from lying on your back to sitting on the side of a flat bed without using bedrails?: A Lot Help needed moving to and from a bed to a chair (including a wheelchair)?: A Lot Help needed standing up from a chair using your arms (e.g., wheelchair or bedside chair)?: A Lot Help needed to walk in hospital room?: Total Help needed climbing 3-5 steps with a railing? : Total 6 Click Score: 10    End of Session Equipment Utilized During Treatment: Gait belt Activity Tolerance: Patient limited by fatigue;Patient limited by pain Patient left: in bed;with call bell/phone within reach;with bed alarm set(notified nursing about needed one more SCD) Nurse Communication: Mobility status PT Visit Diagnosis: Unsteadiness on feet (R26.81);Other abnormalities of gait and mobility (R26.89);Repeated falls (R29.6);Muscle weakness (generalized) (M62.81);History of falling (Z91.81);Difficulty in walking, not elsewhere classified (R26.2);Pain Pain - Right/Left: Left Pain - part of body: Hip     Time: 6728-9791 PT Time Calculation (min) (ACUTE ONLY): 18 min  Charges:  $Therapeutic Activity: 8-22 mins                     Taegen Delker B. Migdalia Dk PT, DPT Acute Rehabilitation Services Pager [phone] Office [phone]    [place] 05/08/2018, 1:50 PM

## 2018-05-08 NOTE — TOC Transition Note (Signed)
Transition of Care [facility]) - CM/SW Discharge Note   Patient Details  Name: Laura Cabrera MRN: [id number] Date of Birth: [birth year]  [facility] CM/SW Contact:  Alberteen Sam, LCSW Phone Number: 05/08/2018, 12:28 PM   Clinical Narrative:     Patient will DC to: [facility] Anticipated DC date: 05/08/2018 Family notified: Zigmund Daniel (daughter) Transport CW:CBJS  Per MD patient ready for DC to [facility] . RN, patient, patient's family, and facility notified of DC. Discharge Summary sent to facility. RN given number for report [phone]. DC packet on chart. Ambulance transport requested for patient.  CSW signing off.  [place], [place]   Final next level of care: Skilled Nursing Facility Barriers to Discharge: No Barriers Identified   Patient Goals and CMS Choice Patient states their goals for this hospitalization and ongoing recovery are:: to go to rehab then home CMS Medicare.gov Compare Post Acute Care list provided to:: Patient Represenative (must comment)(Kay (daughter)) Choice offered to / list presented to : Adult Children  Discharge Placement PASRR number recieved: 05/07/18            Patient chooses bed at: [facility] Patient to be transferred to facility by: [facility] Name of family member notified: Zigmund Daniel (daughter) Patient and family notified of of transfer: 05/08/18  Discharge Plan and Services   Discharge Planning Services: NA Post Acute Care Choice: [place]          DME Arranged: N/A DME Agency: NA HH Arranged: NA HH Agency: NA   Social Determinants of Health (SDOH) Interventions     Readmission Risk Interventions No flowsheet data found.

## 2018-05-08 NOTE — TOC Initial Note (Signed)
Transition of Care [facility]) - Initial/Assessment Note    Patient Details  Name: Laura Cabrera MRN: [id number] Date of Birth: [birth year]  [facility]) CM/SW Contact:    Alberteen Sam, LCSW Phone Number: 05/08/2018, 9:44 AM  Clinical Narrative:                  CSW consulted with patient's daughter Laura Cabrera regarding discharge plan of SNF. CSW explained PT recommendation of patient going to short term rehab at skilled nursing facility for continued PT. Patient's daughter Laura Cabrera in agreement and expressed understanding of recommendations. Daughter Laura Cabrera reports preference of [facility]. [facility] unfortunately does not have beds at this time, daughter Laura Cabrera in agreement with [facility] semi private bed at this time. CSW explained no visitor policy currently and provided patient's daughter Laura Cabrera with phone number to admissions at [facility] to address any questions she may have. [facility] insurance Josem Kaufmann is being waived currently therefore patient can DC to [facility] once dc paperwork is in.   Expected Discharge Plan: Skilled Nursing Facility Barriers to Discharge: No Barriers Identified   Patient Goals and CMS Choice Patient states their goals for this hospitalization and ongoing recovery are:: to go to rehab then go home CMS Medicare.gov Compare Post Acute Care list provided to:: Patient Represenative (must comment)(daughter Laura Cabrera) Choice offered to / list presented to : Adult Children  Expected Discharge Plan and Services Expected Discharge Plan: [place]   Discharge Planning Services: NA Post Acute Care Choice: [place] Living arrangements for the past 2 months: Single Family Home                 DME Arranged: N/A DME Agency: NA HH Arranged: NA HH Agency: NA  Prior Living Arrangements/Services Living arrangements for the past 2 months: [place] Lives with:: Self Patient language and need for interpreter reviewed:: Yes Do you feel safe going back  to the place where you live?: No   will need rehab before safe discharge home  Need for Family Participation in Patient Care: Yes (Comment) Care giver support system in place?: Yes (comment)   Criminal Activity/Legal Involvement Pertinent to Current Situation/Hospitalization: No - Comment as needed  Activities of Daily Living Home Assistive Devices/Equipment: Walker (specify type) ADL Screening (condition at time of admission) Patient's cognitive ability adequate to safely complete daily activities?: Yes Is the patient deaf or have difficulty hearing?: Yes Does the patient have difficulty seeing, even when wearing glasses/contacts?: No Does the patient have difficulty concentrating, remembering, or making decisions?: No Patient able to express need for assistance with ADLs?: Yes Does the patient have difficulty dressing or bathing?: Yes Independently performs ADLs?: Yes (appropriate for developmental age) Does the patient have difficulty walking or climbing stairs?: Yes Weakness of Legs: Both Weakness of Arms/Hands: None  Permission Sought/Granted Permission sought to share information with : Case Manager, Customer service manager, Family Supports Permission granted to share information with : Yes, Verbal Permission Granted  Share Information with NAME: Laura Cabrera  Permission granted to share info w AGENCY: SNFs  Permission granted to share info w Relationship: daughter  Permission granted to share info w Contact Information: [phone]  Emotional Assessment Appearance:: Appears stated age Attitude/Demeanor/Rapport: Unable to Assess Affect (typically observed): Unable to Assess Orientation: : Oriented to Self, Oriented to  Time, Oriented to Place, Oriented to Situation Alcohol / Substance Use: Not Applicable Psych Involvement: No (comment)  Admission diagnosis:  Fall [W19.XXXA] Closed displaced oblique fracture of shaft of left femur,  initial encounter [facility]) [S72.332A] Fall,  initial encounter [W19.XXXA] Patient Active Problem List   Diagnosis Date Noted  . Closed displaced oblique fracture of shaft of femur ([place]) 05/06/2018  . Hypertension 05/06/2018  . History of stroke 05/06/2018  . Thoracic ascending aortic aneurysm ([place]) 05/06/2018  . CKD (chronic kidney disease), stage III ([place]) 05/06/2018   PCP:  Dineen Kid, MD Pharmacy:   [id number] - [place], [place] - [address] [place] [address] Phone: [phone] Fax: [phone]     Social Determinants of Health (SDOH) Interventions    Readmission Risk Interventions No flowsheet data found.

## 2018-05-08 NOTE — Discharge Summary (Signed)
Physician Discharge Summary  Laura Cabrera SJG:[id number] DOB: [birth year] DOA: 05/05/2018  PCP: Dineen Kid, MD  Admit date: 05/05/2018 Discharge date: 05/08/2018  Admitted From: Home  Disposition:  SNF  Recommendations for Outpatient Follow-up and new medication changes:  1. Follow up with Dr. Andria Rhein in 7 days.  2. Please follow cell count next week, 05/11/18. Discharge hb is 7 with hct at 21.5.   Home Health: na   Equipment/Devices: na    Discharge Condition: stable  CODE STATUS: full  Diet recommendation: regular.   Brief/Interim Summary: 85 year old female who presented with left leg pain after mechanical fall. She does have significant past medical history for hypertension, dyslipidemia, history of CVA,ascending thoracic aortic aneurysm and chronic kidney disease stage III. On the day of admission she sustained a mechanical fall from her own height, no loss of consciousness or head trauma. After the fall she was unable to bear weight due to significant pain. On the initial physical examination her blood pressure was 158/50, heart rate 62, respiratory rate 16, oxygen saturation 96%, moist mucous membranes, lungs clear to auscultation bilaterally, heart S1-S2 present and rhythmic abdomen soft, no lower extremity edema, range of motion limited of the left lower extremity due to pain, her left foot was everted.Sodium 136, potassium 4.3, chloride 101, bicarb 24, glucose 139, BUN 20, creatinine 1.2, white count 12.2, hemoglobin 9.9, hematocrit 31.9, platelets 130. Urine analysis negative for infection.Head and cervical spine CT with no acute changes.Her chest x-ray had cardiomegaly with prominent aortic notch. Femur x-rays with displaced angulated proximal femoral shaft fracture. EKG had 60 bpm, normal axis normal intervals, sinus rhythm, right bundle branch block, no significant ST segment or T wave changes.  Patient was admitted to the hospital with a working diagnosis of  acute displaced angulated proximal femoral shaft fracture.  1.  Acute left femoral proximal shaft fracture.  Patient was admitted to the medical ward, she received analgesics and DVT prophylaxis.  She was evaluated by orthopedic surgery and underwent intramedullary nailing of left femur fracture with no major complications.  She was seen by physical therapy, recommendations to continue therapy at a skilled nursing facility.  2. HTN. Continue blood pressure control with propranolol.  3. CKD stage 3. Her renal function remained stable, her discharge serum creatinine was 1,22.  4. Post op anemia. Her hb has dropped to 7,0 with Hct 21. Will recommend follow cell count in 3 days. Patient with no chest pain or dyspnea, no signs of active bleeding.   5. Hx of CVA. Will continue clopidogrel for now, no signs of bleeding.    Discharge Diagnoses:  Principal Problem:   Closed displaced oblique fracture of shaft of femur ([facility]) Active Problems:   Hypertension   History of stroke   Thoracic ascending aortic aneurysm (H[facility])   CKD (chronic kidney disease), stage III [facility])    Discharge Instructions   Allergies as of 05/08/2018      Reactions   Penicillins    Sulfamethoxazole Other (See Comments)   Unsure- childhood   Penicillin G Rash      Medication List    TAKE these medications   clopidogrel 75 MG tablet Commonly known as:  PLAVIX Take 75 mg by mouth daily.   latanoprost 0.005 % ophthalmic solution Commonly known as:  XALATAN Place 1 drop into both eyes at bedtime.   oxyCODONE 5 MG immediate release tablet Commonly known as:  Oxy IR/ROXICODONE Take 1 tablet (5 mg total) by mouth every 6 (six) hours  as needed for moderate pain or breakthrough pain.   propranolol 40 MG tablet Commonly known as:  INDERAL Take 40 mg by mouth daily.      Follow-up Information    Haddix, Thomasene Lot, MD. Schedule an appointment as soon as possible for a visit in 2 week(s).   Specialty:  Orthopedic  Surgery Why:  Repeat x-rays Contact information: [place] [address] [phone]          Allergies  Allergen Reactions  . Penicillins   . Sulfamethoxazole Other (See Comments)    Unsure- childhood  . Penicillin G Rash    Consultations:  Orthopedic   Procedures/Studies: Dg Chest 2 View  Result Date: 05/05/2018 CLINICAL DATA:  85 year old post fall with confusion. Unwitnessed fall, found on living room floor on left side. EXAM: CHEST - 2 VIEW COMPARISON:  Chest CT 04/08/2018 FINDINGS: Cardiomegaly unchanged from prior. Tortuosity and atherosclerosis of the thoracic aorta unchanged. No acute airspace disease, pleural effusion, or pneumothorax. No pulmonary edema. No acute osseous abnormalities are seen. Remote left rib fractures unchanged. IMPRESSION: 1. No acute findings. 2. Unchanged cardiomegaly.  Aortic Atherosclerosis (ICD10-I70.0). Electronically Signed   By: Keith Rake M.D.   On: 05/05/2018 23:14   Dg Pelvis 1-2 Views  Result Date: 05/05/2018 CLINICAL DATA:  85 year old post fall with confusion. Found on floor lying on left side. Left lower extremity pain. EXAM: PELVIS - 1-2 VIEW COMPARISON:  Concurrent left femur radiographs. Pelvic radiograph 04/03/2017 FINDINGS: Left proximal femoral shaft fracture is partially included. Femoral head remains seated. No additional fracture of the pelvis. Pubic rami are intact. Bones are under mineralized. IMPRESSION: Left proximal femoral shaft fracture, partially included. No additional pelvic fracture. Electronically Signed   By: Keith Rake M.D.   On: 05/05/2018 23:16   Ct Head Wo Contrast  Result Date: 05/05/2018 CLINICAL DATA:  85 year old female found down. On Xarelto. EXAM: CT HEAD WITHOUT CONTRAST CT CERVICAL SPINE WITHOUT CONTRAST TECHNIQUE: Multidetector CT imaging of the head and cervical spine was performed following the standard protocol without intravenous contrast. Multiplanar CT image  reconstructions of the cervical spine were also generated. COMPARISON:  None available. FINDINGS: CT HEAD FINDINGS Brain: Confluent bilateral cerebral white matter hypodensity. Chronic appearing left PICA infarct of the cerebellum. No acute intracranial hemorrhage identified. No midline shift, mass effect, or evidence of intracranial mass lesion. No ventriculomegaly. No acute cortically based infarct identified. Vascular: Extensive Calcified atherosclerosis at the skull base.6 No suspicious intracranial vascular hyperdensity. Skull: Osteopenia at the skull base. No acute osseous abnormality identified. Sinuses/Orbits: Visualized paranasal sinuses and mastoids are clear. Other: No scalp hematoma identified. Negative orbits. CT CERVICAL SPINE FINDINGS Alignment: straightening of cervical lordosis with mild degenerative appearing anterolisthesis at C3-C4 and C7-T1. Skull base and vertebrae: Severe degenerative changes at the skull base through odontoid. Visualized skull base is intact. No atlanto-occipital dissociation. No acute osseous abnormality identified. Soft tissues and spinal canal: No prevertebral fluid or swelling. No visible canal hematoma. Calcified carotid atherosclerosis, otherwise negative noncontrast neck soft tissues. Disc levels: Very severe bilateral C1-C2 joint space loss, sclerosis and bulky osteophytosis (coronal image 20). Bilateral C2-C3 posterior element ankylosis. Widespread bilateral cervical facet arthropathy. Severe disc and endplate degeneration C3-J6 and C6-C7, with suspected mild degenerative spinal stenosis at those levels. Upper chest: Visible upper thoracic levels and lung apices appear grossly negative. IMPRESSION: 1. No acute traumatic injury identified in the head or cervical spine. 2. No acute intracranial abnormality. Chronic appearing left PICA infarct. Advanced  chronic small vessel disease. 3. 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  Moderate and severe cervical spine degeneration elsewhere. Electronically Signed   By: Genevie Ann M.D.   On: 05/05/2018 23:28   Ct Cervical Spine Wo Contrast  Result Date: 05/05/2018 CLINICAL DATA:  85 year old female found down. On Xarelto. EXAM: CT HEAD WITHOUT CONTRAST CT CERVICAL SPINE WITHOUT CONTRAST TECHNIQUE: Multidetector CT imaging of the head and cervical spine was performed following the standard protocol without intravenous contrast. Multiplanar CT image reconstructions of the cervical spine were also generated. COMPARISON:  None available. FINDINGS: CT HEAD FINDINGS Brain: Confluent bilateral cerebral white matter hypodensity. Chronic appearing left PICA infarct of the cerebellum. No acute intracranial hemorrhage identified. No midline shift, mass effect, or evidence of intracranial mass lesion. No ventriculomegaly. No acute cortically based infarct identified. Vascular: Extensive Calcified atherosclerosis at the skull base.6 No suspicious intracranial vascular hyperdensity. Skull: Osteopenia at the skull base. No acute osseous abnormality identified. Sinuses/Orbits: Visualized paranasal sinuses and mastoids are clear. Other: No scalp hematoma identified. Negative orbits. CT CERVICAL SPINE FINDINGS Alignment: straightening of cervical lordosis with mild degenerative appearing anterolisthesis at C3-C4 and C7-T1. Skull base and vertebrae: Severe degenerative changes at the skull base through odontoid. Visualized skull base is intact. No atlanto-occipital dissociation. No acute osseous abnormality identified. Soft tissues and spinal canal: No prevertebral fluid or swelling. No visible canal hematoma. Calcified carotid atherosclerosis, otherwise negative noncontrast neck soft tissues. Disc levels: Very severe bilateral C1-C2 joint space loss, sclerosis and bulky osteophytosis (coronal image 20). Bilateral C2-C3 posterior element ankylosis. Widespread bilateral cervical facet arthropathy. Severe disc and  endplate degeneration Y6-R4 and C6-C7, with suspected mild degenerative spinal stenosis at those levels. Upper chest: Visible upper thoracic levels and lung apices appear grossly negative. IMPRESSION: 1. No acute traumatic injury identified in the head or cervical spine. 2. No acute intracranial abnormality. Chronic appearing left PICA infarct. Advanced chronic small vessel disease. 3. 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 Moderate and severe cervical spine degeneration elsewhere. Electronically Signed   By: Genevie Ann M.D.   On: 05/05/2018 23:28   Dg Knee Complete 4 Views Left  Result Date: 05/05/2018 CLINICAL DATA:  85 year old post fall with confusion. Found on floor lying on left side. Left lower extremity pain. EXAM: LEFT KNEE - COMPLETE 4+ VIEW COMPARISON:  None. FINDINGS: No evidence of fracture, dislocation, or joint effusion. Mild medial greater than lateral tibiofemoral joint space narrowing. Soft tissues are unremarkable. There are vascular calcifications. IMPRESSION: No acute fracture or subluxation of the left knee. Electronically Signed   By: Keith Rake M.D.   On: 05/05/2018 23:15   Dg C-arm 1-60 Min  Result Date: 05/06/2018 CLINICAL DATA:  Intraoperative imaging for fixation of a left femur fracture which the patient suffered in a fall 05/05/2018. Initial encounter. EXAM: DG C-ARM 61-120 MIN; LEFT FEMUR 2 VIEWS COMPARISON:  Plain films left femur 05/05/2018. FINDINGS: Eight fluoroscopic intraoperative spot views of the left femur are provided. Images demonstrate placement of an intramedullary nail with 2 proximal and 2 distal screws for fixation of a proximal diaphyseal fracture. Hardware is intact. Position and alignment are anatomic. No acute abnormality. IMPRESSION: Intraoperative imaging for ORIF of a proximal diaphyseal fracture of the left femur. No acute abnormality. Electronically Signed   By: Inge Rise M.D.   On: 05/06/2018 14:03    Dg Femur Min 2 Views Left  Result Date: 05/06/2018 CLINICAL DATA:  Intraoperative imaging for fixation  of a left femur fracture which the patient suffered in a fall 05/05/2018. Initial encounter. EXAM: DG C-ARM 61-120 MIN; LEFT FEMUR 2 VIEWS COMPARISON:  Plain films left femur 05/05/2018. FINDINGS: Eight fluoroscopic intraoperative spot views of the left femur are provided. Images demonstrate placement of an intramedullary nail with 2 proximal and 2 distal screws for fixation of a proximal diaphyseal fracture. Hardware is intact. Position and alignment are anatomic. No acute abnormality. IMPRESSION: Intraoperative imaging for ORIF of a proximal diaphyseal fracture of the left femur. No acute abnormality. Electronically Signed   By: Inge Rise M.D.   On: 05/06/2018 14:03   Dg Femur Min 2 Views Left  Result Date: 05/05/2018 CLINICAL DATA:  85 year old post fall with confusion. Found on floor lying on left side. Left lower extremity pain. EXAM: LEFT FEMUR 2 VIEWS COMPARISON:  None. FINDINGS: Proximal femoral shaft fracture with displacement of approximately 1 shaft width and apex lateral angulation. Osseous overriding approximately 3.3 cm. Nondisplaced component extends to the subtrochanteric region but no definite intertrochanteric involvement. Hip and knee alignment are maintained. There are vascular calcifications. IMPRESSION: Displaced angulated proximal femoral shaft fracture. Electronically Signed   By: Keith Rake M.D.   On: 05/05/2018 23:18   Dg Femur Port Min 2 Views Left  Result Date: 05/06/2018 CLINICAL DATA:  Status post left femur fixation EXAM: LEFT FEMUR PORTABLE 2 VIEWS COMPARISON:  Films from earlier in the same day. FINDINGS: Medullary rod is noted within the left femur with proximal and distal fixation screws. The fracture fragments are in near anatomic alignment. IMPRESSION: Status post ORIF of left femoral fracture. Electronically Signed   By: Inez Catalina M.D.   On:  05/06/2018 16:56       Subjective: Patient feeling better, positive headache. Pain is controlled with analgesics.   Discharge Exam: Vitals:   05/07/18 1931 05/08/18 0415  BP: (!) 147/79 (!) 148/69  Pulse: 63 60  Resp: 14 16  Temp: 98.3 F (36.8 C) 98.6 F (37 C)  SpO2: 96% 94%   Vitals:   05/06/18 2041 05/07/18 0419 05/07/18 1931 05/08/18 0415  BP: (!) 134/50 (!) 147/86 (!) 147/79 (!) 148/69  Pulse: 62 (!) 114 63 60  Resp: 14 14 14 16   Temp: 97.7 F (36.5 C) 98.7 F (37.1 C) 98.3 F (36.8 C) 98.6 F (37 C)  TempSrc: Oral Oral Oral Oral  SpO2: 94% 94% 96% 94%  Weight:      Height:        General: Not in pain or dyspnea Neurology: Awake and alert, non focal  E ENT: no pallor, no icterus, oral mucosa moist Cardiovascular: No JVD. S1-S2 present, rhythmic, no gallops, rubs, or murmurs. No lower extremity edema. Pulmonary: positive breath sounds bilaterally, adequate air movement, no wheezing, rhonchi or rales. Gastrointestinal. Abdomen with no organomegaly, non tender, no rebound or guarding Skin. No rashes Musculoskeletal: no joint deformities   The results of significant diagnostics from this hospitalization (including imaging, microbiology, ancillary and laboratory) are listed below for reference.     Microbiology: Recent Results (from the past 240 hour(s))  MRSA PCR Screening     Status: None   Collection Time: 05/06/18  2:31 AM  Result Value Ref Range Status   MRSA by PCR NEGATIVE NEGATIVE Final    Comment:        The GeneXpert MRSA Assay (FDA approved for NASAL specimens only), is one component of a comprehensive MRSA colonization surveillance program. It is not intended to diagnose MRSA infection  nor to guide or monitor treatment for MRSA infections. Performed at [facility], [place] [address] [place], [place] [address]      Labs: BNP (last 3 results) No results for input(s): BNP in the last 8760 hours. Basic Metabolic Panel: Recent Labs   Lab 05/05/18 2338 05/06/18 0348 05/07/18 0350  NA 136 135 133*  K 4.3 4.0 4.3  CL 101 103 100  CO2 24 24 24   GLUCOSE 139* 159* 149*  BUN 20 20 23   CREATININE 1.22* 1.20* 1.22*  CALCIUM 9.1 8.8* 8.3*   Liver Function Tests: Recent Labs  Lab 05/05/18 2338  AST 40  ALT 13  ALKPHOS 65  BILITOT 0.9  PROT 6.3*  ALBUMIN 3.3*   No results for input(s): LIPASE, AMYLASE in the last 168 hours. No results for input(s): AMMONIA in the last 168 hours. CBC: Recent Labs  Lab 05/05/18 2338 05/06/18 0348 05/07/18 0350 05/08/18 0816  WBC 12.2* 9.9 7.9 9.6  NEUTROABS 10.4*  --  6.3  --   HGB 9.9* 9.6* 7.7* 7.0*  HCT 31.9* 29.1* 24.2* 21.5*  MCV 99.7 97.7 96.0 97.7  PLT 130* 117* 101* 96*   Cardiac Enzymes: Recent Labs  Lab 05/05/18 2338  CKTOTAL 168   BNP: Invalid input(s): POCBNP CBG: No results for input(s): GLUCAP in the last 168 hours. D-Dimer No results for input(s): DDIMER in the last 72 hours. Hgb A1c No results for input(s): HGBA1C in the last 72 hours. Lipid Profile No results for input(s): CHOL, HDL, LDLCALC, TRIG, CHOLHDL, LDLDIRECT in the last 72 hours. Thyroid function studies No results for input(s): TSH, T4TOTAL, T3FREE, THYROIDAB in the last 72 hours.  Invalid input(s): FREET3 Anemia work up No results for input(s): VITAMINB12, FOLATE, FERRITIN, TIBC, IRON, RETICCTPCT in the last 72 hours. Urinalysis    Component Value Date/Time   COLORURINE STRAW (A) 05/05/2018 2201   APPEARANCEUR CLEAR 05/05/2018 2201   LABSPEC 1.012 05/05/2018 2201   PHURINE 7.0 05/05/2018 2201   GLUCOSEU NEGATIVE 05/05/2018 2201   HGBUR NEGATIVE 05/05/2018 2201   BILIRUBINUR NEGATIVE 05/05/2018 2201   KETONESUR NEGATIVE 05/05/2018 2201   PROTEINUR NEGATIVE 05/05/2018 2201   NITRITE NEGATIVE 05/05/2018 2201   LEUKOCYTESUR NEGATIVE 05/05/2018 2201   Sepsis Labs Invalid input(s): PROCALCITONIN,  WBC,  LACTICIDVEN Microbiology Recent Results (from the past 240 hour(s))   MRSA PCR Screening     Status: None   Collection Time: 05/06/18  2:31 AM  Result Value Ref Range Status   MRSA by PCR NEGATIVE NEGATIVE Final    Comment:        The GeneXpert MRSA Assay (FDA approved for NASAL specimens only), is one component of a comprehensive MRSA colonization surveillance program. It is not intended to diagnose MRSA infection nor to guide or monitor treatment for MRSA infections. Performed at [facility], [place] [address], [place], [place] [address]      Time coordinating discharge: 45 minutes  SIGNED:   Tawni Millers, MD  [facility] 05/08/2018, 11:36 AM

## 2018-05-13 ENCOUNTER — Emergency Department (HOSPITAL_COMMUNITY): Payer: Medicare Other

## 2018-05-13 ENCOUNTER — Emergency Department (HOSPITAL_COMMUNITY)
Admission: EM | Admit: 2018-05-13 | Discharge: 2018-05-13 | Disposition: A | Discharge status: Home / Self Care | Payer: Medicare Other | Attending: Emergency Medicine | Admitting: Emergency Medicine

## 2018-05-13 ENCOUNTER — Other Ambulatory Visit: Payer: Self-pay

## 2018-05-13 ENCOUNTER — Encounter (HOSPITAL_COMMUNITY): Payer: Self-pay | Admitting: Emergency Medicine

## 2018-05-13 DIAGNOSIS — R600 Localized edema: Secondary | ICD-10-CM | POA: Insufficient documentation

## 2018-05-13 DIAGNOSIS — Z7902 Long term (current) use of antithrombotics/antiplatelets: Secondary | ICD-10-CM | POA: Diagnosis not present

## 2018-05-13 DIAGNOSIS — Y999 Unspecified external cause status: Secondary | ICD-10-CM | POA: Insufficient documentation

## 2018-05-13 DIAGNOSIS — S0083XA Contusion of other part of head, initial encounter: Secondary | ICD-10-CM | POA: Insufficient documentation

## 2018-05-13 DIAGNOSIS — Y92122 Bedroom in nursing home as the place of occurrence of the external cause: Secondary | ICD-10-CM | POA: Diagnosis not present

## 2018-05-13 DIAGNOSIS — Z853 Personal history of malignant neoplasm of breast: Secondary | ICD-10-CM | POA: Diagnosis not present

## 2018-05-13 DIAGNOSIS — Y939 Activity, unspecified: Secondary | ICD-10-CM | POA: Diagnosis not present

## 2018-05-13 DIAGNOSIS — I129 Hypertensive chronic kidney disease with stage 1 through stage 4 chronic kidney disease, or unspecified chronic kidney disease: Secondary | ICD-10-CM | POA: Insufficient documentation

## 2018-05-13 DIAGNOSIS — S0990XA Unspecified injury of head, initial encounter: Secondary | ICD-10-CM | POA: Diagnosis present

## 2018-05-13 DIAGNOSIS — Z8673 Personal history of transient ischemic attack (TIA), and cerebral infarction without residual deficits: Secondary | ICD-10-CM | POA: Diagnosis not present

## 2018-05-13 DIAGNOSIS — W19XXXA Unspecified fall, initial encounter: Secondary | ICD-10-CM | POA: Diagnosis not present

## 2018-05-13 DIAGNOSIS — N183 Chronic kidney disease, stage 3 (moderate): Secondary | ICD-10-CM | POA: Diagnosis not present

## 2018-05-13 DIAGNOSIS — R1032 Left lower quadrant pain: Secondary | ICD-10-CM | POA: Diagnosis not present

## 2018-05-13 DIAGNOSIS — R55 Syncope and collapse: Secondary | ICD-10-CM | POA: Diagnosis not present

## 2018-05-13 LAB — URINALYSIS, ROUTINE W REFLEX MICROSCOPIC
Bilirubin Urine: NEGATIVE
Glucose, UA: NEGATIVE mg/dL
Hgb urine dipstick: NEGATIVE
Ketones, ur: NEGATIVE mg/dL
Leukocytes,Ua: NEGATIVE
Nitrite: NEGATIVE
Protein, ur: NEGATIVE mg/dL
Specific Gravity, Urine: >1.046 — ABNORMAL HIGH (ref 1.005–1.030)
pH: 5 (ref 5.0–8.0)

## 2018-05-13 LAB — COMPREHENSIVE METABOLIC PANEL
ALT: 15 U/L (ref 0–44)
AST: 55 U/L — ABNORMAL HIGH (ref 15–41)
Albumin: 2.9 g/dL — ABNORMAL LOW (ref 3.5–5.0)
Alkaline Phosphatase: 47 U/L (ref 38–126)
Anion gap: 7 (ref 5–15)
BUN: 41 mg/dL — ABNORMAL HIGH (ref 8–23)
CO2: 22 mmol/L (ref 22–32)
Calcium: 8.5 mg/dL — ABNORMAL LOW (ref 8.9–10.3)
Chloride: 106 mmol/L (ref 98–111)
Creatinine, Ser: 1.3 mg/dL — ABNORMAL HIGH (ref 0.44–1.00)
GFR calc Af Amer: 42 mL/min — ABNORMAL LOW (ref >60)
GFR calc non Af Amer: 36 mL/min — ABNORMAL LOW (ref >60)
Glucose, Bld: 108 mg/dL — ABNORMAL HIGH (ref 70–99)
Potassium: 4 mmol/L (ref 3.5–5.1)
Sodium: 135 mmol/L (ref 135–145)
Total Bilirubin: 1.4 mg/dL — ABNORMAL HIGH (ref 0.3–1.2)
Total Protein: 5.6 g/dL — ABNORMAL LOW (ref 6.5–8.1)

## 2018-05-13 LAB — CBC WITH DIFFERENTIAL/PLATELET
Abs Immature Granulocytes: 0.59 10*3/uL — ABNORMAL HIGH (ref 0.00–0.07)
Basophils Absolute: 0 10*3/uL (ref 0.0–0.1)
Basophils Relative: 0 %
Eosinophils Absolute: 0.3 10*3/uL (ref 0.0–0.5)
Eosinophils Relative: 2 %
HCT: 24.8 % — ABNORMAL LOW (ref 36.0–46.0)
Hemoglobin: 7.5 g/dL — ABNORMAL LOW (ref 12.0–15.0)
Immature Granulocytes: 5 %
Lymphocytes Relative: 11 %
Lymphs Abs: 1.3 10*3/uL (ref 0.7–4.0)
MCH: 31.1 pg (ref 26.0–34.0)
MCHC: 30.2 g/dL (ref 30.0–36.0)
MCV: 102.9 fL — ABNORMAL HIGH (ref 80.0–100.0)
Monocytes Absolute: 1 10*3/uL (ref 0.1–1.0)
Monocytes Relative: 8 %
Neutro Abs: 8.7 10*3/uL — ABNORMAL HIGH (ref 1.7–7.7)
Neutrophils Relative %: 74 %
Platelets: 178 10*3/uL (ref 150–400)
RBC: 2.41 MIL/uL — ABNORMAL LOW (ref 3.87–5.11)
RDW: 14.8 % (ref 11.5–15.5)
WBC: 11.9 10*3/uL — ABNORMAL HIGH (ref 4.0–10.5)
nRBC: 0 % (ref 0.0–0.2)

## 2018-05-13 LAB — TROPONIN I: Troponin I: <0.03 ng/mL (ref <0.03)

## 2018-05-13 MED ORDER — IOPAMIDOL (ISOVUE-300) INJECTION 61%
80.0000 mL | Freq: Once | INTRAVENOUS | Status: AC | PRN
  Administered 2018-05-13: 80 mL via INTRAVENOUS

## 2018-05-13 NOTE — ED Notes (Signed)
Pt's daughter Zigmund Daniel called, given update

## 2018-05-13 NOTE — ED Notes (Signed)
Laura Cabrera - pt's daughter notified of fall and results/plan to check urine specimen. Will call with further updates

## 2018-05-13 NOTE — Discharge Instructions (Signed)
Make sure you are eating and drinking regularly.  Get up slow so you do not become lightheaded.  Your CAT scan of head and neck and pelvis today was normal without broken bones or bleeding.  Your surgical site is in good shape.  If you develop chest pain , shortness of breath, palpitations or fever/vomiting you need to return for recheck.

## 2018-05-13 NOTE — ED Notes (Signed)
Pt found sitting down on floor in room, back propped up against stretcher. Denies any new pain. Orientation remains a&ox2. VSS stable, see chart. Dr. Maryan Rued notified, assessed at bedside

## 2018-05-13 NOTE — ED Triage Notes (Signed)
Pt in from [facility] facility after unwitnessed fall around 0500 this am. Per EMS, staff then moved pt to dining room for breakfast where she went "unresponsive x 10 min". EMS then called, pt takes Plavix. Has L lateral head hematoma present. Recent L Femur fx, is c/o low abdomen pain on arrival. C-collar on

## 2018-05-13 NOTE — ED Provider Notes (Signed)
[facility] EMERGENCY DEPARTMENT Provider Note   CSN: [id number] Arrival date & time: 05/13/18  0900    History   Chief Complaint Chief Complaint  Patient presents with   Fall    HPI Laura Cabrera is a 85 y.o. female.     Patient is a 85 year old female with a history of hypertension, thoracic ascending aneurysm, chronic kidney disease stage III, recent femur fracture related to a fall resulting in IM nailing and discharge 6 days ago presenting today from her facility for a fall and syncope.  Patient was found this morning on the floor next to her bed with an unwitnessed fall.  She was awake and alert and staff helped her back to bed.  At that point there was noted to be some trauma to the left side of her head but she had no other complaints.  They then took her to the dining room and got her some breakfast.  Someone went to check on her and she was unresponsive.  They took her back to her room as she was still unresponsive and states did they could not feel a pulse and called 911.  When paramedics arrived patient was awake and alert.  Her only complaint is of some left lower abdominal pain, pelvic pain and some back pain.  She denies any chest pain or shortness of breath.  She states she has passed out before but cannot remember what happened.  She denies headache or neck pain at this time.  She is currently on Plavix but no other anticoagulation.  She states that she feels okay.  The history is provided by the patient and the EMS personnel.  Fall  This is a new problem. The current episode started 3 to 5 hours ago. The problem occurs constantly. The problem has been resolved. Associated symptoms comments: syncope.    Past Medical History:  Diagnosis Date   Cancer [facility])    right breast cancer   Hypertension    Raynaud disease    Stroke [facility])     Patient Active Problem List   Diagnosis Date Noted   Closed displaced oblique fracture of shaft of femur  ([place]) 05/06/2018   Hypertension 05/06/2018   History of stroke 05/06/2018   Thoracic ascending aortic aneurysm ([place]) 05/06/2018   CKD (chronic kidney disease), stage III ([place]) 05/06/2018    Past Surgical History:  Procedure Laterality Date   INTRAMEDULLARY (IM) NAIL INTERTROCHANTERIC N/A 05/06/2018   Procedure: INTRAMEDULLARY (IM) NAIL INTERTROCHANTRIC;  Surgeon: Shona Needles, MD;  Location: [place];  Service: Orthopedics;  Laterality: N/A;   MASTECTOMY     MASTECTOMY Right 2018     OB History   No obstetric history on file.      Home Medications    Prior to Admission medications   Medication Sig Start Date End Date Taking? Authorizing Provider  clopidogrel (PLAVIX) 75 MG tablet Take 75 mg by mouth daily. 04/20/18   [provider]  latanoprost (XALATAN) 0.005 % ophthalmic solution Place 1 drop into both eyes at bedtime. 03/13/18   [provider]  oxyCODONE (OXY IR/ROXICODONE) 5 MG immediate release tablet Take 1 tablet (5 mg total) by mouth every 6 (six) hours as needed for moderate pain or breakthrough pain. 05/08/18   Arrien, Jimmy Picket, MD  propranolol (INDERAL) 40 MG tablet Take 40 mg by mouth daily. 12/10/17   [provider]    Family History Family History  Problem Relation Age of Onset  Stroke Mother    Hypertension Father    Coronary artery disease Father     Social History Social History   Tobacco Use   Smoking status: Never Smoker   Smokeless tobacco: Never Used  Substance Use Topics   Alcohol use: Never    Frequency: Never   Drug use: Never     Allergies   Penicillins; Sulfamethoxazole; and Penicillin g   Review of Systems Review of Systems  All other systems reviewed and are negative.    Physical Exam Updated Vital Signs BP 132/67    Pulse (!) 40    Temp 97.8 F (36.6 C) (Oral)    Resp 15    Wt 52 kg    SpO2 95%    BMI 20.31 kg/m   Physical Exam Vitals signs and nursing note reviewed.    Constitutional:      General: She is not in acute distress.    Appearance: She is well-developed.  HENT:     Head: Normocephalic. Contusion present.   Eyes:     Pupils: Pupils are equal, round, and reactive to light.  Neck:     Musculoskeletal: Normal range of motion. No muscular tenderness.  Cardiovascular:     Rate and Rhythm: Normal rate and regular rhythm.     Heart sounds: Normal heart sounds. No murmur. No friction rub.  Pulmonary:     Effort: Pulmonary effort is normal.     Breath sounds: Normal breath sounds. No wheezing or rales.  Abdominal:     General: Bowel sounds are normal. There is no distension.     Palpations: Abdomen is soft.     Tenderness: There is abdominal tenderness in the left lower quadrant. There is no guarding or rebound.  Musculoskeletal: Normal range of motion.        General: No tenderness.     Comments: Significant pitting edema and healing ecchymosis over the left femur.  Surgical incisions are clean dried and have no drainage.  They appear to be healing well.  Skin:    General: Skin is warm and dry.     Findings: No rash.     Comments: Significant bruising and edema over the left lower extremity.  Multiple areas of bruising on the upper and lower extremities in different stages of healing  Neurological:     Mental Status: She is alert.     Cranial Nerves: No cranial nerve deficit.     Comments: Patient is able to move bilateral upper extremities without difficulty.  She is able to wiggle her toes and has intact sensation.  She can follow commands without difficulty and speech is clear.  Oriented to person and place  Psychiatric:        Behavior: Behavior normal.     Comments: cooperative      ED Treatments / Results  Labs (all labs ordered are listed, but only abnormal results are displayed) Labs Reviewed  CBC WITH DIFFERENTIAL/PLATELET - Abnormal; Notable for the following components:      Result Value   WBC 11.9 (*)    RBC 2.41 (*)     Hemoglobin 7.5 (*)    HCT 24.8 (*)    MCV 102.9 (*)    Neutro Abs 8.7 (*)    Abs Immature Granulocytes 0.59 (*)    All other components within normal limits  COMPREHENSIVE METABOLIC PANEL - Abnormal; Notable for the following components:   Glucose, Bld 108 (*)    BUN 41 (*)  Creatinine, Ser 1.30 (*)    Calcium 8.5 (*)    Total Protein 5.6 (*)    Albumin 2.9 (*)    AST 55 (*)    Total Bilirubin 1.4 (*)    GFR calc non Af Amer 36 (*)    GFR calc Af Amer 42 (*)    All other components within normal limits  URINALYSIS, ROUTINE W REFLEX MICROSCOPIC - Abnormal; Notable for the following components:   Specific Gravity, Urine >1.046 (*)    All other components within normal limits  TROPONIN I    EKG EKG Interpretation  Date/Time:  Wednesday May 13 2018 09:13:30 EDT Ventricular Rate:  57 PR Interval:    QRS Duration: 101 QT Interval:  467 QTC Calculation: 455 R Axis:   56 Text Interpretation:  Sinus rhythm Ventricular premature complex RSR' in V1 or V2, right VCD or RVH No significant change since last tracing Confirmed by Blanchie Dessert [phone]) on 05/13/2018 9:29:47 AM   Radiology Dg Chest 2 View  Result Date: 05/13/2018 CLINICAL DATA:  Syncope, fall EXAM: CHEST - 2 VIEW COMPARISON:  05/05/2018 FINDINGS: Cardiomegaly. Tortuous, calcified aorta. No confluent opacities or effusions. No acute bony abnormality. IMPRESSION: Cardiomegaly.  No active disease. Aortic atherosclerosis. Electronically Signed   By: Rolm Baptise M.D.   On: 05/13/2018 10:03   Ct Head Wo Contrast  Result Date: 05/13/2018 CLINICAL DATA:  Un witnessed fall. Patient unresponsive for 10 minutes. Left lateral head hematoma. EXAM: CT HEAD WITHOUT CONTRAST TECHNIQUE: Contiguous axial images were obtained from the base of the skull through the vertex without intravenous contrast. COMPARISON:  05/05/2018 FINDINGS: Brain: No evidence of acute infarction, hemorrhage, hydrocephalus, extra-axial collection or mass  lesion/mass effect. There is age appropriate mild ventricular and sulcal enlargement. Old left posterior inferior cerebellar artery distribution infarct. Patchy white matter hypoattenuation is noted bilaterally consistent with moderate chronic microvascular ischemic change. Vascular: No hyperdense vessel or unexpected calcification. Skull: Normal. Negative for fracture or focal lesion. Sinuses/Orbits: Globes and orbits are unremarkable. Sinuses and mastoid air cells are clear. Other: None. IMPRESSION: 1. No acute intracranial abnormalities. 2. Age-appropriate volume loss. Old left cerebellar infarct and moderate chronic microvascular ischemic change. Electronically Signed   By: Lajean Manes M.D.   On: 05/13/2018 11:13   Ct Abdomen Pelvis W Contrast  Result Date: 05/13/2018 CLINICAL DATA:  Abdominal trauma, no other fall today this morning. EXAM: CT ABDOMEN AND PELVIS WITH CONTRAST TECHNIQUE: Multidetector CT imaging of the abdomen and pelvis was performed using the standard protocol following bolus administration of intravenous contrast. CONTRAST:  12mL ISOVUE-300 IOPAMIDOL (ISOVUE-300) INJECTION 61% COMPARISON:  02/27/2017, 04/08/2018 FINDINGS: Lower chest: Chronic reticulonodular interstitial disease in the right middle lobe unchanged from 02/27/2017. Hepatobiliary: No focal liver abnormality is seen. No gallstones, gallbladder wall thickening, or biliary dilatation. Pancreas: Stable 12 mm cystic pancreatic tail mass. No pancreatic ductal dilatation or surrounding inflammatory changes. Spleen: Normal in size without focal abnormality. Adrenals/Urinary Tract: Normal adrenal glands. 4.5 cm hypodense, fluid attenuating left inferior pole renal mass most consistent with a cyst. 12 mm right hypodense, fluid attenuating renal mass most consistent with a cyst. No obstructive uropathy. No urolithiasis. Normal bladder. Stomach/Bowel: Moderate size hiatal hernia. Stomach is otherwise normal. No bowel wall thickening,  bowel dilatation or inflammatory changes. No pneumatosis, pneumoperitoneum or portal venous gas. Vascular/Lymphatic: Abdominal aortic atherosclerosis. No lymphadenopathy Reproductive: Status post hysterectomy. No adnexal masses. Other: No abdominal wall hernia or abnormality. No abdominopelvic ascites. Musculoskeletal: Interval ORIF of a proximal left  femoral diaphysis fracture partially visualized. Postsurgical changes in the soft tissues overlying the left hip. Generalized osteopenia. No acute fracture or subluxation. No aggressive osseous lesion. Degenerative disc disease with disc height loss at L2-3, L3-4, L4-5 and L5-S1 with bilateral facet arthropathy. IMPRESSION: 1. No acute abdominal or pelvic injury. 2. Interval ORIF of a proximal left femoral diaphysis fracture partially visualized. Postsurgical changes in the soft tissues overlying the left hip. 3.  Aortic Atherosclerosis (ICD10-I70.0). 4. 12 mm pancreatic tail cystic mass. Recommend follow up pre and post contrast MRI/MRCP or pancreatic protocol CT in 2 years. This recommendation follows ACR consensus guidelines: Management of Incidental Pancreatic Cysts: A White Paper of the ACR Incidental Findings Committee. [place] [id number]. Electronically Signed   By: Kathreen Devoid   On: 05/13/2018 11:25   Dg Femur Min 2 Views Left  Result Date: 05/13/2018 CLINICAL DATA:  Syncope, fall EXAM: LEFT FEMUR 2 VIEWS COMPARISON:  05/06/2018 FINDINGS: Hardware noted across an oblique proximal femoral shaft fracture. This has a stable appearance since prior study. No increased displacement or new fracture. No evidence of healing currently. IMPRESSION: Internal fixation across recent fracture through the proximal femoral shaft. No evidence of new displacement or healing currently. No additional acute bony abnormality. Electronically Signed   By: Rolm Baptise M.D.   On: 05/13/2018 10:04    Procedures Procedures (including critical care  time)  Medications Ordered in ED Medications - No data to display   Initial Impression / Assessment and Plan / ED Course  I have reviewed the triage vital signs and the nursing notes.  Pertinent labs & imaging results that were available during my care of the patient were reviewed by me and considered in my medical decision making (see chart for details).       Elderly female presenting today with another fall and mild injury to the left side of her head on Plavix.  However patient also had what appears to be a syncopal event at the facility.  Patient is awake and alert here with reassuring blood pressure and pulse.  She is able to answer questions and is complaining of some mild left lower quadrant pain and hip pain.  Patient recently had IM nailing of the femur fracture and was discharged 6 days ago.  Pulses and sensation are intact distally and surgical site appears to be healing well.  Patient states she has had syncope before but cannot remember when.  She denies any shortness of breath or chest pain.  Patient's hemoglobin was 7 when she left and no report of it being rechecked at this time.  Concern for possible anemia causing her syncope versus a cardiac cause.  Patient is having no chest pain or shortness of breath and lower suspicion for PE at this time as she is on Plavix.  Will do a head CT and cervical spine to ensure no acute injury as well as a CT of the abdomen pelvis and femur films to ensure no acute injury on top of her recent injury.  EKG is unchanged without evidence of prolonged QT or dysrhythmia.  And chest x-ray also pending.  Patient at this time is in no acute distress.  1:38 PM Patient's CT of the head and neck are without acute findings, CT of the abdomen pelvis without any acute injury or new fractures.  Patient's hardware is in place of the femur.  Patient's labs show unchanged creatinine, hemoglobin slightly improved at 7.5 and troponin within normal limits.  Chest  x-ray with cardiomegaly but no other acute findings.  Patient try to get out of bed here and slid off the end of the bed onto the floor.  She did not hit her head or lose consciousness.  Patient is not complaining of any additional pain in her leg.  She states she was just needing to go to the bathroom and nobody was coming.  Patient does not seem confused at this time and is able to answer all questions appropriately.  Will check to ensure she is not developing a UTI but otherwise there is no specific reason for admission today.  Feel that she did have a syncopal event at the nursing facility but has had no instability here.    2:19 PM uA wnl.  Pt is eating and in no acute distress.  Vital signs remain normal. Pt is stable for DC back to facility.  Final Clinical Impressions(s) / ED Diagnoses   Final diagnoses:  Fall, initial encounter  Syncope, unspecified syncope type    ED Discharge Orders    None       Blanchie Dessert, MD 05/13/18 [phone]

## 2018-05-13 NOTE — ED Notes (Signed)
Attempted to call report x2

## 2018-05-13 NOTE — ED Notes (Signed)
Patient transported to CT 

## 2018-05-13 NOTE — ED Notes (Signed)
Attempted to call report x 1  

## 2018-05-27 ENCOUNTER — Other Ambulatory Visit: Payer: Self-pay

## 2018-05-27 ENCOUNTER — Encounter (HOSPITAL_COMMUNITY): Payer: Self-pay | Admitting: *Deleted

## 2018-05-27 NOTE — Anesthesia Preprocedure Evaluation (Addendum)
Anesthesia Evaluation  Patient identified by MRN, date of birth, ID band Patient awake    Reviewed: Allergy & Precautions, NPO status , Patient's Chart, lab work & pertinent test results, reviewed documented beta blocker date and time   Airway Mallampati: II  TM Distance: >3 FB Neck ROM: Full    Dental no notable dental hx. (+) Edentulous Upper, Edentulous Lower, Dental Advisory Given   Pulmonary neg pulmonary ROS,    Pulmonary exam normal breath sounds clear to auscultation       Cardiovascular hypertension, Pt. on home beta blockers and Pt. on medications + CAD and + Peripheral Vascular Disease  Normal cardiovascular exam Rhythm:Regular Rate:Normal  ascending TAA (4.6 cm 03/2018)   Neuro/Psych CVA (on plavix) negative psych ROS   GI/Hepatic Neg liver ROS, GERD  ,  Endo/Other  Hypothyroidism   Renal/GU Renal InsufficiencyRenal disease  negative genitourinary   Musculoskeletal negative musculoskeletal ROS (+)   Abdominal   Peds  Hematology  (+) Blood dyscrasia (Hgb 7.5), anemia ,   Anesthesia Other Findings S/p left femur IM nail 05/06/18 now presenting after fall at SNF, seen in ED 05/13/18 for recurrent syncopal episode, questionably unresponsive at SNF for 10 min  CTA chest 04/08/18 ([place]): IMPRESSION: 1. Stable 4.6 cm ascending thoracic aortic aneurysm. Recommend semi-annual imaging followup by CTA or MRA and referral to cardiothoracic surgery if not already obtained. 2. Atherosclerosis, including aortoiliac and coronary artery Disease.  EKG: 05/13/18: Sinus rhythm Ventricular premature complex RSR' in V1 or V2, right VCD or RVH No significant change since last tracing  Reproductive/Obstetrics                          Anesthesia Physical Anesthesia Plan  ASA: III  Anesthesia Plan: General   Post-op Pain Management:    Induction: Intravenous  PONV Risk Score and  Plan: 3 and Treatment may vary due to age or medical condition, Ondansetron and Dexamethasone  Airway Management Planned: Oral ETT  Additional Equipment: Arterial line  Intra-op Plan:   Post-operative Plan: Extubation in OR  Informed Consent: I have reviewed the patients History and Physical, chart, labs and discussed the procedure including the risks, benefits and alternatives for the proposed anesthesia with the patient or authorized representative who has indicated his/her understanding and acceptance.     Dental advisory given  Plan Discussed with: CRNA  Anesthesia Plan Comments: (.  )     Anesthesia Quick Evaluation

## 2018-05-27 NOTE — Progress Notes (Signed)
Pt gave verbal consent for nurse to speak with daughters, Zigmund Daniel and Jeani Hawking. Pt daughter, Zigmund Daniel, stated that pt had not been given pre-op instructions regarding Plavix. Lindsi, Surgical Coordinator, stated that she would make MD aware that pt took Plavix today. Lindsi, stated that she would also follow up with pt regarding pre-op Plavix instructions for DOS.

## 2018-05-27 NOTE — Progress Notes (Signed)
Anesthesiology note:  Laura Cabrera is a 85 year old female who is scheduled to undergo hardware removal from the left femur on 05/28/2018.  She underwent ORIF of a left intertrochanteric femur fracture on 05/06/2018.  This was done under general anesthesia and was uneventful.  She then was discharged to a nursing home and on 05/13/2018 suffered an unwitnessed fall.  She was found to be awake and alert but subsequently had a period of approximately 5 to 10 minutes where she was unresponsive.  EMS was called and she was brought to the emergency room.  Work-up included head CT cardiac troponins C-spine films EKG which were unremarkable and she was discharged home from the emergency department.  She is now scheduled to undergo removal of a screw which is impinging upon her hip joint and causing severe pain with movement.  After reviewing her chart with Myra Gianotti and discussing the case with Dr. Doreatha Martin we felt that it was best to bring the patient in and evaluate her prior to surgery.  If she appeared stable she can undergo the surgery and be admitted later if need be.  Given the semiacute nature of her hip pain due to the impinging screw we felt that delaying the surgery for an extended period of time was not in the patient's best interest.  Roberts Gaudy, MD

## 2018-05-27 NOTE — Progress Notes (Signed)
Anesthesia Chart Review: Laura Cabrera   Case:  [id number] Date/Time:  05/28/18 0715   Procedure:  HARDWARE REMOVAL LEFT FEMUR,EXCHANGE OF SCREW LEFT FEMUR (Left )   Anesthesia type:  General   Pre-op diagnosis:  HARDWARE FAILURE LEFT FEMUR   Location:  [facility] OR ROOM 04 / [place] OR   Surgeon:  Shona Needles, MD      DISCUSSION: Patient is a 85 year old female scheduled for the above procedure.   - On 05/05/18 patient presented to [facility] ED via EMS after a neighbor found patient on the floor with inability to bear weight on LLE. She denied LOC, but notes indicate she did not recall much about the fall, but EMS noted a folder rug corner near the patient. Imaging revealed a displaced angulated left proximal femoral shaft fracture, s/p IM nailing 05/06/18. 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 She was discharged to [facility] SNF on 05/08/18.  - On 05/13/18 patient seen in ED following unwitnessed fall at [facility]. She was awake and alert, but staff noted some trauma to the left side of her head (hematoma). She was taken to the dining room and when rechecked patient was unresponsive for approximately 10 minutes. ED notes indicate that staff could not feel a pulse, but that she was awake and alert when EMS arrived. She reported a prior history of syncope without known specific details, and it was believed to have had recurrent syncopal event. Head CT showed old infarct, but no acute abnormality. No acute findings on abd/pelvic CT, c-spine, CXR. EKG was stable without evidence of prolonged QT or dysrhythmia, Patient anemia with HGB 7.5, but improved from prior. Troponin and UA okay. Last neck imaging seen was from 05/05/18 ED visit. She was discharged back to SNF.   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    Patient's daughter indicated that patient's PCP Dineen Kid, MD recently referred patient to "cardiology" for on-going management of TAA; however, appointment has not taken place yet given current COVID restrictions. (According to 04/17/18 note by Dr. Andria Rhein, patient had slight enlargement of her TAA with recommendation to recheck in 6 months. Although "it doesn't need treatment yet, it is getting closer to possibly needing treatment at 5.0 cm" so he referred her to vascular surgery.) TAA was felt stable at 4.6 cm per 03/2018 CTA.  HGB 05/13/18 was 7.5. Pre-IM nailing on 05/06/18 her HGB 9.9, but fell as low as 7.0 (on 05/08/18) post-operatively. Patient is on Plavix (for CVA history) and had not been given instructions to hold for surgery. Dr. Barth Kirks, Dr. Tama Headings surgical coordinator, notified that patient last took Plavix on 05/27/18. She will notify Dr. Doreatha Martin.  Above reviewed with anesthesiologist Roberts Gaudy, MD. He called and spoke with Dr. Doreatha Martin. Patient to be further assessed on the day of surgery.   PROVIDERS: Dineen Kid, MD is PCP ([place]) - Pulmonologist is with [facility] in [facility]. Last visit 02/24/18 [facility] Ke[facility]  LABS: Labs as of 05/13/18 included: Lab Results  Component Value Date   WBC 11.9 (H) 05/13/2018   HGB 7.5 (L)  05/13/2018   HCT 24.8 (L) 05/13/2018   PLT 178 05/13/2018   GLUCOSE 108 (H) 05/13/2018   ALT 15 05/13/2018   AST 55 (H) 05/13/2018   NA 135 05/13/2018   K 4.0 05/13/2018   CL 106 05/13/2018   CREATININE 1.30 (H) 05/13/2018   BUN 41 (H) 05/13/2018   CO2 22 05/13/2018   CBC Latest Ref Rng & Units 05/13/2018 05/08/2018 05/07/2018  WBC 4.0 - 10.5  K/uL 11.9(H) 9.6 7.9  Hemoglobin 12.0 - 15.0 g/dL 7.5(L) 7.0(L) 7.7(L)  Hematocrit 36.0 - 46.0 % 24.8(L) 21.5(L) 24.2(L)  Platelets 150 - 400 K/uL 178 96(L) 101(L)    IMAGES: Head CT 05/13/18: IMPRESSION: 1. No acute intracranial abnormalities. 2. Age-appropriate volume loss. Old left cerebellar infarct and moderate chronic microvascular ischemic change.  CT abd/pelvis 05/13/18: IMPRESSION: 1. No acute abdominal or pelvic injury. 2. Interval ORIF of a proximal left femoral diaphysis fracture partially visualized. Postsurgical changes in the soft tissues overlying the left hip. 3.  Aortic Atherosclerosis (ICD10-I70.0). 4. 12 mm pancreatic tail cystic mass. Recommend follow up pre and post contrast MRI/MRCP or pancreatic protocol CT in 2 years. This recommendation follows ACR consensus guidelines: Management of Incidental Pancreatic Cysts: A White Paper of the ACR Incidental Findings Committee. [place] 9562;13:086-578.  2V CXR 05/13/18: FINDINGS: Cardiomegaly. Tortuous, calcified aorta. No confluent opacities or effusions. No acute bony abnormality. IMPRESSION: Cardiomegaly.  No active disease. Aortic atherosclerosis.  CT head/c-spine 05/05/18: IMPRESSION: 1. No acute traumatic injury identified in the head or cervical spine. 2. No acute intracranial abnormality. Chronic appearing left PICA infarct. Advanced chronic small vessel disease. 3. 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 Moderate and severe cervical spine degeneration elsewhere.  CTA chest 04/08/18 ([address]): IMPRESSION: 1. Stable 4.6 cm ascending thoracic aortic aneurysm. Recommend semi-annual imaging followup by CTA or MRA and referral to cardiothoracic surgery if not already obtained. This recommendation follows 2010 ACCF/AHA/AATS/ACR/ASA/SCA/SCAI/SIR/STS/SVM Guidelines for the Diagnosis and Management of Patients With Thoracic Aortic Disease.  Circulation. 2010; 121: I696-E952 2. Atherosclerosis, including aortoiliac and coronary artery disease. Please note that although the presence of coronary artery calcium documents the presence of coronary artery disease, the severity of this disease and any potential stenosis cannot be assessed on this non-gated CT examination. Assessment for potential risk factor modification, dietary therapy or pharmacologic therapy may be warranted, if clinically indicated. 3. No evidence of metastatic disease in the chest, abdomen, or pelvis. 4. Descending and sigmoid diverticulosis. 5. Cystic pancreatic lesions, incompletely characterized, without significant change from 02/27/2017. Consider MR pancreas with contrast for further characterization to exclude neoplasm.   EKG: 05/13/18: Sinus rhythm Ventricular premature complex RSR' in V1 or V2, right VCD or RVH No significant change since last tracing Confirmed by Blanchie Dessert [phone]) on 05/13/2018 9:29:47 AM   CV:  Past Medical History:  Diagnosis Date  . Anemia   . Aortic aneurysm [facility])    per daughter Zigmund Daniel  . Cancer [facility])    right breast cancer  . Femur fracture (HCC)    left  . GERD (gastroesophageal reflux disease)   . Glaucoma   . Headache    migraine  . HOH (hard of hearing)   . Hypertension   . Hypothyroidism    PMH  . Pelvic fracture ([place])   . Pneumonia   . Raynaud disease   . Stroke ([place])   . TIA (transient ischemic attack)   . Wears glasses   . Wears hearing  aid    B/L  . Wears partial dentures     Past Surgical History:  Procedure Laterality Date  . CATARACT EXTRACTION W/ INTRAOCULAR LENS  IMPLANT, BILATERAL    . dental implants    . INTRAMEDULLARY (IM) NAIL INTERTROCHANTERIC N/A 05/06/2018   Procedure: INTRAMEDULLARY (IM) NAIL INTERTROCHANTRIC;  Surgeon: Shona Needles, MD;  Location: [place];  Service: Orthopedics;  Laterality: N/A;  . MASTECTOMY    . MASTECTOMY Right 2018    MEDICATIONS: No current  facility-administered medications for this encounter.    . bisacodyl (DULCOLAX) 5 MG EC tablet  . clopidogrel (PLAVIX) 75 MG tablet  . ferrous sulfate 325 (65 FE) MG tablet  . latanoprost (XALATAN) 0.005 % ophthalmic solution  . mirabegron ER (MYRBETRIQ) 25 MG TB24 tablet  . oxyCODONE (OXY IR/ROXICODONE) 5 MG immediate release tablet  . propranolol (INDERAL) 40 MG tablet    Myra Gianotti, PA-C Surgical Short Stay/Anesthesiology [facility] Phone [phone] [facility] Phone [phone] 05/27/2018 3:49 PM

## 2018-05-27 NOTE — H&P (Addendum)
Orthopaedic Trauma Service (OTS) H&P  Patient ID: Laura Cabrera MRN: [id number] DOB/AGE: Jun 24, 1925 85 y.o.  Reason for Surgery: hardware failure/ painful hardware left femur   HPI: Laura Cabrera is an 85 y.o. female presenting for surgery for screw removal/ exchange screw left femur. Patient had a ground-level fall which resulted in a left femoral shaft fracture on 05/05/18. She was admitted to [facility] and underwent intramedullary nailing of the left femur on 05/06/18. She tolerated the procedure well and was discharged on 05/08/18 to rehab facility. Since discharge from hospital patient sustained multiple falls while in acute rehab facility. Most recent fall 05/13/18, she hit her head and was seen in [facility] ED. Since that time she has returned to living at home and is under the care of her two daughters. At patient's first post-operative visit, she had complaints of increasing pain in the left hip over the past several days. Pain worse with any movement of the hip. Denies any numbness or tingling. Had previously been controlling her pain with Tylenol but now requiring strong narcotic pain medications. She is having increasing difficulty with ambulation and performing her home health physical therapy exercises. Imaging performed during this visit showed that one of the recon screws had medialized and was now partially sitting in the hip joint which has resulted in increased pain and difficulty with motion. In order for patient to progress with phyicial therapy and regain ability to perform ADLs independently without significant pain, I would recommend removal and exchange of this screw.  Patient is currently on Plavix, her last dose was 05/27/18.  Past Medical History:  Diagnosis Date  . Anemia   . Aortic aneurysm [facility])    per daughter Laura Cabrera  . Cancer [facility])    right breast cancer  . Femur fracture (HCC)    left  . GERD (gastroesophageal reflux disease)   . Glaucoma   . Headache     migraine  . HOH (hard of hearing)   . Hypertension   . Hypothyroidism    PMH  . Pelvic fracture ([place])   . Pneumonia   . Raynaud disease   . Stroke ([place])   . TIA (transient ischemic attack)   . Wears glasses   . Wears hearing aid    B/L  . Wears partial dentures     Past Surgical History:  Procedure Laterality Date  . CATARACT EXTRACTION W/ INTRAOCULAR LENS  IMPLANT, BILATERAL    . dental implants    . INTRAMEDULLARY (IM) NAIL INTERTROCHANTERIC N/A 05/06/2018   Procedure: INTRAMEDULLARY (IM) NAIL INTERTROCHANTRIC;  Surgeon: Shona Needles, MD;  Location: [place];  Service: Orthopedics;  Laterality: N/A;  . MASTECTOMY    . MASTECTOMY Right 2018    Family History  Problem Relation Age of Onset  . Stroke Mother   . Hypertension Father   . Coronary artery disease Father     Social History:  reports that she has never smoked. She has never used smokeless tobacco. She reports that she does not drink alcohol or use drugs.  Allergies:  Allergies  Allergen Reactions  . Sulfamethoxazole Other (See Comments)    UNSPECIFIED CHILDHOOD REACTION   . Penicillin G Rash    Did it involve swelling of the face/tongue/throat, SOB, or low BP? No Did it involve sudden or severe rash/hives, skin peeling, or any reaction on the inside of your mouth or nose? No Did you need to seek medical attention at a hospital or doctor's office? No  When did it last happen?Adolescent allergy If all above answers are "NO", may proceed with cephalosporin use.     Medications: I have reviewed the patient's current medications.  ROS: Constitutional: No fever or chills Vision: No changes in vision ENT: No difficulty swallowing CV: No chest pain Pulm: No SOB or wheezing GI: No nausea or vomiting GU: No urgency or inability to hold urine Skin: No poor wound healing Neurologic: No numbness or tingling Psychiatric: No depression or anxiety Heme: No bruising Allergic: No reaction to medications or  food   Exam: There were no vitals taken for this visit. General: sitting in wheelchair, NAD, pleasant and cooperative Orientation: Alert and oriented x 3 Mood and Affect: Mood and affect appropriate Gait: Not assessed Coordination and balance: Within normal limits  Left lower extremity: Skin reveals well healing incisions. No signs of infection. Minimal tenderness to palpation of thigh, knee, lower leg. Significant pain with any motion of the hip. Knee ROM without significant discomfort. Sensory and motor functio intact distally. Neurovascularly intact  Right lower extremity: Skin without lesions. No tenderness to palpation. Full painless ROM, full strength in each muscle group without evidence of instability.   Medical Decision Making: Imaging: AP and lateral view of left hip/femur  show proximal screw has medialized into the hip joint.   Labs: No results found for this or any previous visit (from the past 24 hour(s)).  Medical history and chart was reviewed  Assessment/Plan: 85 year old female s/p intramedullary nail left femur fracture on 05/06/18, now with hardware failure.  I would recommeend removal of the screw that now partially sits in the hip joint. Without removal of this screw, patient will continue to have pain with any motion of the hip and I am afraid this will wear away at the joint and result in arthritis of the hip. Will plan to return to OR tomorrow for screw removal/exchange screw. Risks and benefits of procedure discussed with patient and her daughters in the outpatient clinic. Risks discussed included bleeding requiring blood transfusion, bleeding causing a hematoma, infection, malunion, nonunion, damage to surrounding nerves and blood vessels, pain, continued pain, hardware failure, stiffness, post-traumatic arthritis, DVT/PE, compartment syndrome, and even death. Patient understands these risks and would like to proceed with surgery. Following surgery, patient will be  admitted to the hospital overnight for observation and pain control. She will mobilize with physical therapy and likely discharge on POD #1.   Laura Cabrera A. [facility] ?[phone]? (phone)

## 2018-05-27 NOTE — Progress Notes (Signed)
Pt denies SOB, chest pain, and being under the care of a cardiologist. Pt daughter, Zigmund Daniel, stated that pt had an appointment with a cardiologist ( name unknown) that was canceled " due to the [facility] Virus." Daughter denies that pt had an echo, stress test and cardiac acth. Daughter stated that pt last dose of Plavix was today. Daughter made aware to have pt stop taking vitamins, fish oil and herbal medications. Do not take any NSAIDs ie: Ibuprofen, Advil, Naproxen (Aleve), Motrin, BC and Goody Powder.  Coronavirus Screening Daughter denies that pt and family members tested positive for COVID -69.   Daughter denies that pt and member of family experienced the following symptoms:  Cough yes/no: No Fever (>100.23F)  yes/no: No Runny nose yes/no: No Sore throat yes/no: No Difficulty breathing/shortness of breath  yes/no: No  Have you or a family member traveled in the last 14 days and where? yes/no: No Daughter reminded that hospital visitation restrictions are in effect and the importance of the restrictions.  Daughter verbalized understanding of all pre-op instructions. PA, Anesthesiology, asked to review pt history; see note.

## 2018-05-28 ENCOUNTER — Observation Stay (HOSPITAL_COMMUNITY)
Admission: RE | Admit: 2018-05-28 | Discharge: 2018-05-29 | Disposition: A | Discharge status: Home / Self Care | Payer: Medicare Other | Attending: Student | Admitting: Student

## 2018-05-28 ENCOUNTER — Ambulatory Visit (HOSPITAL_COMMUNITY): Payer: Medicare Other | Admitting: Anesthesiology

## 2018-05-28 ENCOUNTER — Ambulatory Visit (HOSPITAL_COMMUNITY): Payer: Medicare Other

## 2018-05-28 ENCOUNTER — Encounter (HOSPITAL_COMMUNITY): Payer: Self-pay

## 2018-05-28 ENCOUNTER — Encounter (HOSPITAL_COMMUNITY)
Admission: RE | Disposition: A | Discharge status: Home / Self Care | Payer: Self-pay | Source: Home / Self Care | Attending: Student

## 2018-05-28 ENCOUNTER — Observation Stay (HOSPITAL_COMMUNITY): Payer: Medicare Other

## 2018-05-28 DIAGNOSIS — E039 Hypothyroidism, unspecified: Secondary | ICD-10-CM | POA: Diagnosis not present

## 2018-05-28 DIAGNOSIS — Z79899 Other long term (current) drug therapy: Secondary | ICD-10-CM | POA: Diagnosis not present

## 2018-05-28 DIAGNOSIS — Z7902 Long term (current) use of antithrombotics/antiplatelets: Secondary | ICD-10-CM | POA: Diagnosis not present

## 2018-05-28 DIAGNOSIS — T8484XA Pain due to internal orthopedic prosthetic devices, implants and grafts, initial encounter: Secondary | ICD-10-CM | POA: Diagnosis present

## 2018-05-28 DIAGNOSIS — N183 Chronic kidney disease, stage 3 (moderate): Secondary | ICD-10-CM | POA: Diagnosis not present

## 2018-05-28 DIAGNOSIS — I129 Hypertensive chronic kidney disease with stage 1 through stage 4 chronic kidney disease, or unspecified chronic kidney disease: Secondary | ICD-10-CM | POA: Insufficient documentation

## 2018-05-28 DIAGNOSIS — Y793 Surgical instruments, materials and orthopedic devices (including sutures) associated with adverse incidents: Secondary | ICD-10-CM | POA: Diagnosis not present

## 2018-05-28 DIAGNOSIS — I712 Thoracic aortic aneurysm, without rupture: Secondary | ICD-10-CM | POA: Diagnosis not present

## 2018-05-28 DIAGNOSIS — K219 Gastro-esophageal reflux disease without esophagitis: Secondary | ICD-10-CM | POA: Diagnosis not present

## 2018-05-28 DIAGNOSIS — D649 Anemia, unspecified: Secondary | ICD-10-CM | POA: Diagnosis not present

## 2018-05-28 DIAGNOSIS — Z853 Personal history of malignant neoplasm of breast: Secondary | ICD-10-CM | POA: Diagnosis not present

## 2018-05-28 DIAGNOSIS — H409 Unspecified glaucoma: Secondary | ICD-10-CM | POA: Diagnosis not present

## 2018-05-28 DIAGNOSIS — Z472 Encounter for removal of internal fixation device: Principal | ICD-10-CM | POA: Insufficient documentation

## 2018-05-28 DIAGNOSIS — E785 Hyperlipidemia, unspecified: Secondary | ICD-10-CM | POA: Insufficient documentation

## 2018-05-28 DIAGNOSIS — Z8673 Personal history of transient ischemic attack (TIA), and cerebral infarction without residual deficits: Secondary | ICD-10-CM | POA: Insufficient documentation

## 2018-05-28 DIAGNOSIS — T148XXA Other injury of unspecified body region, initial encounter: Secondary | ICD-10-CM

## 2018-05-28 DIAGNOSIS — Z419 Encounter for procedure for purposes other than remedying health state, unspecified: Secondary | ICD-10-CM

## 2018-05-28 HISTORY — DX: Presence of external hearing-aid: Z97.4

## 2018-05-28 HISTORY — PX: HARDWARE REMOVAL: SHX979

## 2018-05-28 HISTORY — DX: Unspecified glaucoma: H40.9

## 2018-05-28 HISTORY — DX: Headache: R51

## 2018-05-28 HISTORY — DX: Transient cerebral ischemic attack, unspecified: G45.9

## 2018-05-28 HISTORY — DX: Hypothyroidism, unspecified: E03.9

## 2018-05-28 HISTORY — DX: Unspecified hearing loss, unspecified ear: H91.90

## 2018-05-28 HISTORY — DX: Fracture of unspecified parts of lumbosacral spine and pelvis, initial encounter for closed fracture: S32.9XXA

## 2018-05-28 HISTORY — DX: Presence of spectacles and contact lenses: Z97.3

## 2018-05-28 HISTORY — DX: Aortic aneurysm of unspecified site, without rupture: I71.9

## 2018-05-28 HISTORY — DX: Presence of dental prosthetic device (complete) (partial): Z97.2

## 2018-05-28 HISTORY — DX: Unspecified fracture of unspecified femur, initial encounter for closed fracture: S72.90XA

## 2018-05-28 HISTORY — DX: Gastro-esophageal reflux disease without esophagitis: K21.9

## 2018-05-28 HISTORY — DX: Anemia, unspecified: D64.9

## 2018-05-28 HISTORY — DX: Pneumonia, unspecified organism: J18.9

## 2018-05-28 HISTORY — DX: Headache, unspecified: R51.9

## 2018-05-28 LAB — CBC
HCT: 27.7 % — ABNORMAL LOW (ref 36.0–46.0)
Hemoglobin: 8.5 g/dL — ABNORMAL LOW (ref 12.0–15.0)
MCH: 32.9 pg (ref 26.0–34.0)
MCHC: 30.7 g/dL (ref 30.0–36.0)
MCV: 107.4 fL — ABNORMAL HIGH (ref 80.0–100.0)
Platelets: 195 10*3/uL (ref 150–400)
RBC: 2.58 MIL/uL — ABNORMAL LOW (ref 3.87–5.11)
RDW: 16.8 % — ABNORMAL HIGH (ref 11.5–15.5)
WBC: 5.9 10*3/uL (ref 4.0–10.5)
nRBC: 0 % (ref 0.0–0.2)

## 2018-05-28 LAB — BASIC METABOLIC PANEL
Anion gap: 11 (ref 5–15)
BUN: 18 mg/dL (ref 8–23)
CO2: 23 mmol/L (ref 22–32)
Calcium: 9 mg/dL (ref 8.9–10.3)
Chloride: 100 mmol/L (ref 98–111)
Creatinine, Ser: 1.33 mg/dL — ABNORMAL HIGH (ref 0.44–1.00)
GFR calc Af Amer: 40 mL/min — ABNORMAL LOW (ref >60)
GFR calc non Af Amer: 35 mL/min — ABNORMAL LOW (ref >60)
Glucose, Bld: 97 mg/dL (ref 70–99)
Potassium: 4.9 mmol/L (ref 3.5–5.1)
Sodium: 134 mmol/L — ABNORMAL LOW (ref 135–145)

## 2018-05-28 LAB — PROTIME-INR
INR: 1.1 (ref 0.8–1.2)
Prothrombin Time: 13.7 seconds (ref 11.4–15.2)

## 2018-05-28 SURGERY — REMOVAL, HARDWARE
Anesthesia: General | Site: Hip | Laterality: Left

## 2018-05-28 MED ORDER — GLYCOPYRROLATE PF 0.2 MG/ML IJ SOSY
PREFILLED_SYRINGE | INTRAMUSCULAR | Status: DC | PRN
  Administered 2018-05-28 (×2): .1 mg via INTRAVENOUS

## 2018-05-28 MED ORDER — MIRABEGRON ER 25 MG PO TB24
25.0000 mg | ORAL_TABLET | Freq: Every day | ORAL | Status: DC
  Administered 2018-05-28: 25 mg via ORAL
  Filled 2018-05-28: qty 1

## 2018-05-28 MED ORDER — CLOPIDOGREL BISULFATE 75 MG PO TABS
75.0000 mg | ORAL_TABLET | Freq: Every day | ORAL | Status: DC
  Administered 2018-05-29: 75 mg via ORAL
  Filled 2018-05-28: qty 1

## 2018-05-28 MED ORDER — PHENYLEPHRINE 40 MCG/ML (10ML) SYRINGE FOR IV PUSH (FOR BLOOD PRESSURE SUPPORT)
PREFILLED_SYRINGE | INTRAVENOUS | Status: DC | PRN
  Administered 2018-05-28: 120 ug via INTRAVENOUS

## 2018-05-28 MED ORDER — DOCUSATE SODIUM 100 MG PO CAPS
100.0000 mg | ORAL_CAPSULE | Freq: Two times a day (BID) | ORAL | Status: DC
  Administered 2018-05-28 – 2018-05-29 (×2): 100 mg via ORAL
  Filled 2018-05-28 (×2): qty 1

## 2018-05-28 MED ORDER — PHENYLEPHRINE 40 MCG/ML (10ML) SYRINGE FOR IV PUSH (FOR BLOOD PRESSURE SUPPORT)
PREFILLED_SYRINGE | INTRAVENOUS | Status: AC
  Filled 2018-05-28: qty 10

## 2018-05-28 MED ORDER — PROPRANOLOL HCL 20 MG PO TABS
40.0000 mg | ORAL_TABLET | Freq: Every day | ORAL | Status: DC
  Administered 2018-05-29: 40 mg via ORAL
  Filled 2018-05-28: qty 2

## 2018-05-28 MED ORDER — ACETAMINOPHEN 500 MG PO TABS
1000.0000 mg | ORAL_TABLET | Freq: Once | ORAL | Status: AC
  Administered 2018-05-28: 1000 mg via ORAL
  Filled 2018-05-28: qty 2

## 2018-05-28 MED ORDER — TRAMADOL HCL 50 MG PO TABS: 50.0000 mg | ORAL_TABLET | Freq: Four times a day (QID) | ORAL | Status: DC | PRN

## 2018-05-28 MED ORDER — DEXAMETHASONE SODIUM PHOSPHATE 10 MG/ML IJ SOLN
INTRAMUSCULAR | Status: AC
  Filled 2018-05-28: qty 1

## 2018-05-28 MED ORDER — CEFAZOLIN SODIUM-DEXTROSE 2-4 GM/100ML-% IV SOLN
2.0000 g | INTRAVENOUS | Status: AC
  Administered 2018-05-28: 2 g via INTRAVENOUS
  Filled 2018-05-28: qty 100

## 2018-05-28 MED ORDER — FERROUS SULFATE 325 (65 FE) MG PO TABS
325.0000 mg | ORAL_TABLET | Freq: Every day | ORAL | Status: DC
  Administered 2018-05-29: 325 mg via ORAL
  Filled 2018-05-28: qty 1

## 2018-05-28 MED ORDER — SODIUM CHLORIDE 0.9 % IV SOLN
INTRAVENOUS | Status: DC | PRN
  Administered 2018-05-28: 50 ug/min via INTRAVENOUS

## 2018-05-28 MED ORDER — DEXAMETHASONE SODIUM PHOSPHATE 10 MG/ML IJ SOLN
INTRAMUSCULAR | Status: DC | PRN
  Administered 2018-05-28: 4 mg via INTRAVENOUS

## 2018-05-28 MED ORDER — CEFAZOLIN SODIUM-DEXTROSE 2-4 GM/100ML-% IV SOLN
2.0000 g | Freq: Three times a day (TID) | INTRAVENOUS | Status: DC
  Administered 2018-05-28: 2 g via INTRAVENOUS

## 2018-05-28 MED ORDER — ONDANSETRON HCL 4 MG/2ML IJ SOLN
INTRAMUSCULAR | Status: AC
  Filled 2018-05-28: qty 2

## 2018-05-28 MED ORDER — CEFAZOLIN SODIUM-DEXTROSE 2-4 GM/100ML-% IV SOLN
INTRAVENOUS | Status: AC
  Filled 2018-05-28: qty 100

## 2018-05-28 MED ORDER — SUCCINYLCHOLINE CHLORIDE 200 MG/10ML IV SOSY
PREFILLED_SYRINGE | INTRAVENOUS | Status: DC | PRN
  Administered 2018-05-28: 100 mg via INTRAVENOUS

## 2018-05-28 MED ORDER — LATANOPROST 0.005 % OP SOLN
1.0000 [drp] | Freq: Every day | OPHTHALMIC | Status: DC
  Administered 2018-05-28: 1 [drp] via OPHTHALMIC
  Filled 2018-05-28 (×2): qty 2.5

## 2018-05-28 MED ORDER — ONDANSETRON HCL 4 MG/2ML IJ SOLN
INTRAMUSCULAR | Status: DC | PRN
  Administered 2018-05-28: 4 mg via INTRAVENOUS

## 2018-05-28 MED ORDER — PROPOFOL 10 MG/ML IV BOLUS
INTRAVENOUS | Status: AC
  Filled 2018-05-28: qty 20

## 2018-05-28 MED ORDER — ROCURONIUM BROMIDE 50 MG/5ML IV SOSY
PREFILLED_SYRINGE | INTRAVENOUS | Status: AC
  Filled 2018-05-28: qty 5

## 2018-05-28 MED ORDER — FENTANYL CITRATE (PF) 250 MCG/5ML IJ SOLN
INTRAMUSCULAR | Status: DC | PRN
  Administered 2018-05-28: 50 ug via INTRAVENOUS

## 2018-05-28 MED ORDER — SUCCINYLCHOLINE CHLORIDE 200 MG/10ML IV SOSY
PREFILLED_SYRINGE | INTRAVENOUS | Status: AC
  Filled 2018-05-28: qty 10

## 2018-05-28 MED ORDER — FENTANYL CITRATE (PF) 250 MCG/5ML IJ SOLN
INTRAMUSCULAR | Status: AC
  Filled 2018-05-28: qty 5

## 2018-05-28 MED ORDER — FENTANYL CITRATE (PF) 100 MCG/2ML IJ SOLN: 25.0000 ug | INTRAMUSCULAR | Status: DC | PRN

## 2018-05-28 MED ORDER — CEFAZOLIN SODIUM-DEXTROSE 2-4 GM/100ML-% IV SOLN
2.0000 g | Freq: Once | INTRAVENOUS | Status: AC
  Administered 2018-05-28: 2 g via INTRAVENOUS
  Filled 2018-05-28: qty 100

## 2018-05-28 MED ORDER — LACTATED RINGERS IV SOLN
INTRAVENOUS | Status: DC | PRN
  Administered 2018-05-28: 07:00:00 via INTRAVENOUS

## 2018-05-28 MED ORDER — LIDOCAINE 2% (20 MG/ML) 5 ML SYRINGE
INTRAMUSCULAR | Status: DC | PRN
  Administered 2018-05-28: 40 mg via INTRAVENOUS

## 2018-05-28 MED ORDER — MORPHINE SULFATE (PF) 2 MG/ML IV SOLN: 2.0000 mg | INTRAVENOUS | Status: DC | PRN

## 2018-05-28 MED ORDER — EPHEDRINE SULFATE-NACL 50-0.9 MG/10ML-% IV SOSY
PREFILLED_SYRINGE | INTRAVENOUS | Status: DC | PRN
  Administered 2018-05-28 (×2): 10 mg via INTRAVENOUS

## 2018-05-28 MED ORDER — 0.9 % SODIUM CHLORIDE (POUR BTL) OPTIME
TOPICAL | Status: DC | PRN
  Administered 2018-05-28: 08:00:00 1000 mL

## 2018-05-28 MED ORDER — LIDOCAINE 2% (20 MG/ML) 5 ML SYRINGE
INTRAMUSCULAR | Status: AC
  Filled 2018-05-28: qty 5

## 2018-05-28 MED ORDER — ACETAMINOPHEN 325 MG PO TABS
650.0000 mg | ORAL_TABLET | Freq: Four times a day (QID) | ORAL | Status: DC
  Administered 2018-05-28 – 2018-05-29 (×3): 650 mg via ORAL
  Filled 2018-05-28 (×3): qty 2

## 2018-05-28 MED ORDER — PROPOFOL 10 MG/ML IV BOLUS
INTRAVENOUS | Status: DC | PRN
  Administered 2018-05-28: 60 mg via INTRAVENOUS

## 2018-05-28 SURGICAL SUPPLY — 65 items
BANDAGE ACE 4X5 VEL STRL LF (GAUZE/BANDAGES/DRESSINGS) ×3 IMPLANT
BANDAGE ACE 6X5 VEL STRL LF (GAUZE/BANDAGES/DRESSINGS) ×3 IMPLANT
BANDAGE ESMARK 6X9 LF (GAUZE/BANDAGES/DRESSINGS) ×1 IMPLANT
BNDG COHESIVE 6X5 TAN STRL LF (GAUZE/BANDAGES/DRESSINGS) ×3 IMPLANT
BNDG ESMARK 6X9 LF (GAUZE/BANDAGES/DRESSINGS)
BNDG GAUZE ELAST 4 BULKY (GAUZE/BANDAGES/DRESSINGS) ×6 IMPLANT
BRUSH SCRUB SURG 4.25 DISP (MISCELLANEOUS) ×6 IMPLANT
CHLORAPREP W/TINT 26ML (MISCELLANEOUS) ×3 IMPLANT
CLOSURE WOUND 1/2 X4 (GAUZE/BANDAGES/DRESSINGS)
COVER SURGICAL LIGHT HANDLE (MISCELLANEOUS) ×6 IMPLANT
COVER WAND RF STERILE (DRAPES) ×3 IMPLANT
CUFF TOURNIQUET SINGLE 18IN (TOURNIQUET CUFF) IMPLANT
CUFF TOURNIQUET SINGLE 24IN (TOURNIQUET CUFF) IMPLANT
CUFF TOURNIQUET SINGLE 34IN LL (TOURNIQUET CUFF) IMPLANT
DERMABOND ADHESIVE PROPEN (GAUZE/BANDAGES/DRESSINGS) ×2
DERMABOND ADVANCED .7 DNX6 (GAUZE/BANDAGES/DRESSINGS) IMPLANT
DRAPE C-ARM 42X72 X-RAY (DRAPES) IMPLANT
DRAPE C-ARMOR (DRAPES) ×3 IMPLANT
DRAPE ORTHO SPLIT 77X108 STRL (DRAPES) ×4
DRAPE SURG ORHT 6 SPLT 77X108 (DRAPES) IMPLANT
DRAPE U-SHAPE 47X51 STRL (DRAPES) ×3 IMPLANT
DRSG ADAPTIC 3X8 NADH LF (GAUZE/BANDAGES/DRESSINGS) ×3 IMPLANT
DRSG MEPILEX BORDER 4X4 (GAUZE/BANDAGES/DRESSINGS) ×2 IMPLANT
ELECT REM PT RETURN 9FT ADLT (ELECTROSURGICAL) ×3
ELECTRODE REM PT RTRN 9FT ADLT (ELECTROSURGICAL) ×1 IMPLANT
GAUZE SPONGE 4X4 12PLY STRL (GAUZE/BANDAGES/DRESSINGS) ×3 IMPLANT
GLOVE BIO SURGEON STRL SZ 6.5 (GLOVE) ×6 IMPLANT
GLOVE BIO SURGEON STRL SZ7.5 (GLOVE) ×12 IMPLANT
GLOVE BIO SURGEONS STRL SZ 6.5 (GLOVE) ×3
GLOVE BIOGEL PI IND STRL 6.5 (GLOVE) ×1 IMPLANT
GLOVE BIOGEL PI IND STRL 7.5 (GLOVE) ×1 IMPLANT
GLOVE BIOGEL PI INDICATOR 6.5 (GLOVE) ×2
GLOVE BIOGEL PI INDICATOR 7.5 (GLOVE) ×2
GOWN STRL REUS W/ TWL LRG LVL3 (GOWN DISPOSABLE) ×2 IMPLANT
GOWN STRL REUS W/TWL LRG LVL3 (GOWN DISPOSABLE) ×4
KIT BASIN OR (CUSTOM PROCEDURE TRAY) ×3 IMPLANT
KIT TURNOVER KIT B (KITS) ×3 IMPLANT
MANIFOLD NEPTUNE II (INSTRUMENTS) ×1 IMPLANT
NEEDLE 22X1 1/2 (OR ONLY) (NEEDLE) IMPLANT
NS IRRIG 1000ML POUR BTL (IV SOLUTION) ×3 IMPLANT
PACK ORTHO EXTREMITY (CUSTOM PROCEDURE TRAY) ×3 IMPLANT
PAD ARMBOARD 7.5X6 YLW CONV (MISCELLANEOUS) ×6 IMPLANT
PADDING CAST COTTON 6X4 STRL (CAST SUPPLIES) ×9 IMPLANT
SPONGE LAP 18X18 RF (DISPOSABLE) ×3 IMPLANT
STAPLER VISISTAT 35W (STAPLE) IMPLANT
STOCKINETTE IMPERVIOUS LG (DRAPES) ×3 IMPLANT
STRIP CLOSURE SKIN 1/2X4 (GAUZE/BANDAGES/DRESSINGS) IMPLANT
SUCTION FRAZIER HANDLE 10FR (MISCELLANEOUS)
SUCTION TUBE FRAZIER 10FR DISP (MISCELLANEOUS) IMPLANT
SUT ETHILON 3 0 PS 1 (SUTURE) IMPLANT
SUT MNCRL AB 3-0 PS2 18 (SUTURE) ×3 IMPLANT
SUT MON AB 2-0 CT1 36 (SUTURE) ×3 IMPLANT
SUT PDS AB 2-0 CT1 27 (SUTURE) IMPLANT
SUT VIC AB 0 CT1 27 (SUTURE)
SUT VIC AB 0 CT1 27XBRD ANBCTR (SUTURE) IMPLANT
SUT VIC AB 2-0 CT1 27 (SUTURE)
SUT VIC AB 2-0 CT1 TAPERPNT 27 (SUTURE) IMPLANT
SYR CONTROL 10ML LL (SYRINGE) IMPLANT
TOWEL OR 17X24 6PK STRL BLUE (TOWEL DISPOSABLE) ×6 IMPLANT
TOWEL OR 17X26 10 PK STRL BLUE (TOWEL DISPOSABLE) ×6 IMPLANT
TUBE CONNECTING 12'X1/4 (SUCTIONS) ×1
TUBE CONNECTING 12X1/4 (SUCTIONS) ×2 IMPLANT
UNDERPAD 30X30 (UNDERPADS AND DIAPERS) ×3 IMPLANT
WATER STERILE IRR 1000ML POUR (IV SOLUTION) ×6 IMPLANT
YANKAUER SUCT BULB TIP NO VENT (SUCTIONS) ×3 IMPLANT

## 2018-05-28 NOTE — Plan of Care (Signed)

## 2018-05-28 NOTE — Anesthesia Postprocedure Evaluation (Signed)
Anesthesia Post Note  Patient: ZARYA LASSEIGNE  Procedure(s) Performed: HARDWARE REMOVAL LEFT FEMUR,EXCHANGE OF SCREW LEFT FEMUR (Left Hip)     Patient location during evaluation: PACU Anesthesia Type: General Level of consciousness: awake and alert Pain management: pain level controlled Vital Signs Assessment: post-procedure vital signs reviewed and stable Respiratory status: spontaneous breathing, nonlabored ventilation, respiratory function stable and patient connected to nasal cannula oxygen Cardiovascular status: blood pressure returned to baseline and stable Postop Assessment: no apparent nausea or vomiting Anesthetic complications: no    Last Vitals:  Vitals:   05/28/18 1544 05/28/18 1630  BP: (!) 166/91 (!) 177/85  Pulse: 66 61  Resp: 20 16  Temp: (!) 36.4 C   SpO2: 95% 97%    Last Pain:  Vitals:   05/28/18 1625  TempSrc:   PainSc: 0-No pain                 Meliyah Simon L Loran Auguste

## 2018-05-28 NOTE — Discharge Instructions (Signed)
Orthopaedic Trauma Service Discharge Instructions   General Discharge Instructions  WEIGHT BEARING STATUS: weightbearing as tolerated left leg  RANGE OF MOTION/ACTIVITY: Full range of motion of hip and knee as tolerated  Wound Care: dressing can be removed on POD #2 (05/30/18) and incision can be left open to air  DVT/PE prophylaxis: Continue home dose Plavix  Diet: as you were eating previously.  Can use over the counter stool softeners and bowel preparations, such as Miralax, to help with bowel movements.  Narcotics can be constipating.  Be sure to drink plenty of fluids  PAIN MEDICATION USE AND EXPECTATIONS  You have likely been given narcotic medications to help control your pain.  After a traumatic event that results in an fracture (broken bone) with or without surgery, it is ok to use narcotic pain medications to help control one's pain.  We understand that everyone responds to pain differently and each individual patient will be evaluated on a regular basis for the continued need for narcotic medications. Ideally, narcotic medication use should last no more than 6-8 weeks (coinciding with fracture healing).   As a patient it is your responsibility as well to monitor narcotic medication use and report the amount and frequency you use these medications when you come to your office visit.   We would also advise that if you are using narcotic medications, you should take a dose prior to therapy to maximize you participation.  IF YOU ARE ON NARCOTIC MEDICATIONS IT IS NOT PERMISSIBLE TO OPERATE A MOTOR VEHICLE (MOTORCYCLE/CAR/TRUCK/MOPED) OR HEAVY MACHINERY DO NOT MIX NARCOTICS WITH OTHER CNS (CENTRAL NERVOUS SYSTEM) DEPRESSANTS SUCH AS ALCOHOL   STOP SMOKING OR USING NICOTINE PRODUCTS!!!!  As discussed nicotine severely impairs your body's ability to heal surgical and traumatic wounds but also impairs bone healing.  Wounds and bone heal by forming microscopic blood vessels  (angiogenesis) and nicotine is a vasoconstrictor (essentially, shrinks blood vessels).  Therefore, if vasoconstriction occurs to these microscopic blood vessels they essentially disappear and are unable to deliver necessary nutrients to the healing tissue.  This is one modifiable factor that you can do to dramatically increase your chances of healing your injury.    (This means no smoking, no nicotine gum, patches, etc)  DO NOT USE NONSTEROIDAL ANTI-INFLAMMATORY DRUGS (NSAID'S)  Using products such as Advil (ibuprofen), Aleve (naproxen), Motrin (ibuprofen) for additional pain control during fracture healing can delay and/or prevent the healing response.  If you would like to take over the counter (OTC) medication, Tylenol (acetaminophen) is ok.  However, some narcotic medications that are given for pain control contain acetaminophen as well. Therefore, you should not exceed more than 4000 mg of tylenol in a day if you do not have liver disease.  Also note that there are may OTC medicines, such as cold medicines and allergy medicines that my contain tylenol as well.  If you have any questions about medications and/or interactions please ask your doctor/PA or your pharmacist.      ICE AND ELEVATE INJURED/OPERATIVE EXTREMITY  Using ice and elevating the injured extremity above your heart can help with swelling and pain control.  Icing in a pulsatile fashion, such as 20 minutes on and 20 minutes off, can be followed.    Do not place ice directly on skin. Make sure there is a barrier between to skin and the ice pack.    Using frozen items such as frozen peas works well as the conform nicely to the are that needs to  be iced.  USE AN ACE WRAP OR TED HOSE FOR SWELLING CONTROL  In addition to icing and elevation, Ace wraps or TED hose are used to help limit and resolve swelling.  It is recommended to use Ace wraps or TED hose until you are informed to stop.    When using Ace Wraps start the wrapping distally  (farthest away from the body) and wrap proximally (closer to the body)   Example: If you had surgery on your leg or thing and you do not have a splint on, start the ace wrap at the toes and work your way up to the thigh        If you had surgery on your upper extremity and do not have a splint on, start the ace wrap at your fingers and work your way up to the upper arm   [place]: [phone]   VISIT OUR WEBSITE FOR ADDITIONAL INFORMATION: [email or web address]    Discharge Wound Care Instructions  Do NOT apply any ointments, solutions or lotions to pin sites or surgical wounds.  These prevent needed drainage and even though solutions like hydrogen peroxide kill bacteria, they also damage cells lining the pin sites that help fight infection.  Applying lotions or ointments can keep the wounds moist and can cause them to breakdown and open up as well. This can increase the risk for infection. When in doubt call the office.  Surgical incisions should be dressed daily.  If any drainage is noted, use one layer of adaptic, then gauze, Kerlix, and an ace wrap.  Once the incision is completely dry and without drainage, it may be left open to air out.  Showering may begin 36-48 hours later.  Cleaning gently with soap and water.  Traumatic wounds should be dressed daily as well.    One layer of adaptic, gauze, Kerlix, then ace wrap.  The adaptic can be discontinued once the draining has ceased    If you have a wet to dry dressing: wet the gauze with saline the squeeze as much saline out so the gauze is moist (not soaking wet), place moistened gauze over wound, then place a dry gauze over the moist one, followed by Kerlix wrap, then ace wrap.

## 2018-05-28 NOTE — Plan of Care (Signed)
  Problem: Education: Goal: Knowledge of General Education information will improve Description: Including pain rating scale, medication(s)/side effects and non-pharmacologic comfort measures Outcome: Progressing   Problem: Activity: Goal: Risk for activity intolerance will decrease Outcome: Progressing   Problem: Coping: Goal: Level of anxiety will decrease Outcome: Progressing   Problem: Elimination: Goal: Will not experience complications related to bowel motility Outcome: Progressing   Problem: Pain Managment: Goal: General experience of comfort will improve Outcome: Progressing   Problem: Safety: Goal: Ability to remain free from injury will improve Outcome: Progressing   

## 2018-05-28 NOTE — Anesthesia Procedure Notes (Signed)
Arterial Line Insertion Start/End4/16/2020 7:15 AM, 05/28/2018 7:20 AM Performed by: CRNA  Patient location: Pre-op. Preanesthetic checklist: patient identified, IV checked, site marked, risks and benefits discussed, surgical consent, monitors and equipment checked, pre-op evaluation, timeout performed and anesthesia consent Lidocaine 1% used for infiltration Left, radial was placed Catheter size: 20 G Hand hygiene performed , maximum sterile barriers used  and Seldinger technique used Allen's test indicative of satisfactory collateral circulation Attempts: 1 Procedure performed without using ultrasound guided technique. Following insertion, dressing applied and Biopatch. Post procedure assessment: normal  Patient tolerated the procedure well with no immediate complications.

## 2018-05-28 NOTE — Transfer of Care (Signed)
Immediate Anesthesia Transfer of Care Note  Patient: Laura Cabrera  Procedure(s) Performed: HARDWARE REMOVAL LEFT FEMUR,EXCHANGE OF SCREW LEFT FEMUR (Left Hip)  Patient Location: PACU  Anesthesia Type:General  Level of Consciousness: awake, drowsy and patient cooperative  Airway & Oxygen Therapy: Patient Spontanous Breathing and Patient connected to face mask oxygen  Post-op Assessment: Report given to RN and Post -op Vital signs reviewed and stable  Post vital signs: Reviewed and stable  Last Vitals:  Vitals Value Taken Time  BP    Temp    Pulse 60 05/28/2018 10:05 AM  Resp 14 05/28/2018 10:05 AM  SpO2 98 % 05/28/2018 10:05 AM  Vitals shown include unvalidated device data.  Last Pain:  Vitals:   05/28/18 0836  TempSrc:   PainSc: Asleep      Patients Stated Pain Goal: 2 (04/25/92 5859)  Complications: No apparent anesthesia complications

## 2018-05-28 NOTE — Progress Notes (Signed)
Care of pt assumed by MA Ram Haugan RN 

## 2018-05-28 NOTE — Op Note (Signed)
Orthopaedic Surgery Operative Note (CSN: [id number] ) Date of Surgery: 05/28/2018  Admit Date: 05/28/2018   Diagnoses: Pre-Op Diagnoses: Hardware failure with femoral head cutout left femur  Post-Op Diagnosis: Same  Procedures: CPT 20680-Removal of hardware left femur  Surgeons : Primary: Shona Needles, MD  Assistant: Patrecia Pace, PA-C  Location:OR 4   Anesthesia:General  Antibiotics: Ancef 2g preop   Tourniquet time:None  Estimated Blood Loss:Minimal  Complications:None  Specimens:None   Implants: Implant Name Type Inv. Item Serial No. Manufacturer Lot No. LRB No. Used Action  SCREW 6.5MM W/T25 STARDRIVE - [id number] Screw SCREW 6.5MM W/T25 STARDRIVE  [employer] [id number] N/A 1 Explanted    Indications for Surgery: 85 year old female who underwent intramedullary nailing for a left femur fracture was doing well however had increasing pain over the last week.  X-rays that were performed in the outpatient clinic showed penetration of the proximal femoral neck screw into the hip joint.  Due to the intra-articular nature of the hardware I felt that proceeding for urgent removal with appropriate.  I discussed risks and benefits with the patient's daughter including the possibility of repeat failure of the other screw.  Or need for exchanging of the fixation.  The patient and her daughter agreed to proceed with surgery and consent was obtained.  Operative Findings: Removal of intra-articular screw with maintenance of the distal femoral neck screw which had excellent purchase.  Procedure: The patient was identified in the preoperative holding area. Consent was confirmed with the patient and their family and all questions were answered. The operative extremity was marked after confirmation with the patient. she was then brought back to the operating room by our anesthesia colleagues.  She was placed under general anesthetic and carefully transferred over to a radiolucent flat  top table.  A bump was placed under her operative hip. The operative extremity was then prepped and draped in usual sterile fashion. A preoperative timeout was performed to verify the patient, the procedure, and the extremity. Preoperative antibiotics were dosed.  Using fluoroscopy identified the location of the previous incision.  I then incised through the skin and subcutaneous tissue.  I was able to the back the proximal screw out of the hip joint and removed it.  This occurred without difficulty.  I attempted to perform perfect circle technique to place a transverse screw however I felt that this was too close to the other femoral neck screw that I did not want to loosen this fixation.  As result I felt that just leaving the one screw in place would be most appropriate.  Final fluoroscopic images were obtained.  The incision was irrigated.  It was closed with 2-0 Vicryl and 3-0 Monocryl with Dermabond.  A sterile dressing was placed and the patient was awoken from anesthesia and taken to the PACU in stable condition.  Post Op Plan/Instructions: Patient will be weightbearing as tolerated to left lower extremity.  She will receive postoperative antibiotics.  She will continue to receive the Plavix for DVT prophylaxis.  We will observe her overnight and have her work with therapy.  We will discharge her home with follow up in the office in 2 weeks.  I was present and performed the entire surgery.  Patrecia Pace, PA-C did assist me throughout the case. An assistant was necessary given the difficulty in approach, maintenance of reduction and ability to instrument the fracture.   Katha Hamming, MD [facility]

## 2018-05-28 NOTE — Interval H&P Note (Signed)
History and Physical Interval Note:  05/28/2018 7:17 AM  Laura Cabrera  has presented today for surgery, with the diagnosis of HARDWARE FAILURE LEFT FEMUR.  The various methods of treatment have been discussed with the patient and family. After consideration of risks, benefits and other options for treatment, the patient has consented to  Procedure(s): [place] (Left) as a surgical intervention.  The patient's history has been reviewed, patient examined, no change in status, stable for surgery.  I have reviewed the patient's chart and labs.  Questions were answered to the patient's satisfaction.     Lennette Bihari P Corwyn Vora

## 2018-05-28 NOTE — Anesthesia Procedure Notes (Signed)
Procedure Name: Intubation Date/Time: 05/28/2018 7:34 AM Performed by: Renato Shin, CRNA Pre-anesthesia Checklist: Patient identified, Emergency Drugs available, Suction available and Patient being monitored Patient Re-evaluated:Patient Re-evaluated prior to induction Oxygen Delivery Method: Circle system utilized Preoxygenation: Pre-oxygenation with 100% oxygen Induction Type: IV induction and Rapid sequence Laryngoscope Size: Miller and 2 Grade View: Grade I Tube type: Oral Tube size: 7.0 mm Number of attempts: 1 Airway Equipment and Method: Stylet and Oral airway Placement Confirmation: ETT inserted through vocal cords under direct vision,  positive ETCO2 and breath sounds checked- equal and bilateral Secured at: 21 cm Tube secured with: Tape Dental Injury: Teeth and Oropharynx as per pre-operative assessment

## 2018-05-29 ENCOUNTER — Encounter (HOSPITAL_COMMUNITY): Payer: Self-pay | Admitting: Student

## 2018-05-29 DIAGNOSIS — Z472 Encounter for removal of internal fixation device: Secondary | ICD-10-CM | POA: Diagnosis not present

## 2018-05-29 LAB — GLUCOSE, CAPILLARY: Glucose-Capillary: 85 mg/dL (ref 70–99)

## 2018-05-29 LAB — CBC
HCT: 25 % — ABNORMAL LOW (ref 36.0–46.0)
Hemoglobin: 8.2 g/dL — ABNORMAL LOW (ref 12.0–15.0)
MCH: 33.5 pg (ref 26.0–34.0)
MCHC: 32.8 g/dL (ref 30.0–36.0)
MCV: 102 fL — ABNORMAL HIGH (ref 80.0–100.0)
Platelets: 170 10*3/uL (ref 150–400)
RBC: 2.45 MIL/uL — ABNORMAL LOW (ref 3.87–5.11)
RDW: 15.7 % — ABNORMAL HIGH (ref 11.5–15.5)
WBC: 6.5 10*3/uL (ref 4.0–10.5)
nRBC: 0 % (ref 0.0–0.2)

## 2018-05-29 MED ORDER — TRAMADOL HCL 50 MG PO TABS: 50.0000 mg | ORAL_TABLET | Freq: Four times a day (QID) | ORAL | 0 refills | Status: AC | PRN

## 2018-05-29 MED ORDER — ACETAMINOPHEN 500 MG PO TABS: 500.0000 mg | ORAL_TABLET | Freq: Two times a day (BID) | ORAL | 0 refills | Status: AC

## 2018-05-29 NOTE — Progress Notes (Signed)
Orthopaedic Trauma Progress Note  S: Doing well this AM, pain well controlled. Denies any numbness or tingling in the leg and notes improvement in hip pain that was present prior to surgery. Patient able to get up to bedside commode with assistance while I was in the room, she tolerated this well with minimal discomfort in the leg. Physical therapy arrived for visit as I was leaving patient's room.    O:  Vitals:   05/29/18 0127 05/29/18 0506  BP: (!) 153/71 (!) 153/67  Pulse: (!) 58 (!) 55  Resp: 18 18  Temp: 97.7 F (36.5 C) 97.6 F (36.4 C)  SpO2: 99% 99%   General -Sitting up in bed comfortably, NAD Cardiac - heart regular rate and rhtyhm Lungs - No increased work of breathing. CTA anterior lung fields bilaterally LLE - Dressing clean, dry, intact. Minimal swelling of thigh or lower leg. Minimal tenderness to palpation of thigh, hip, knee, or lower leg. Knee ROM without significant discomfort. Full ankle ROM. Sensory and motor function intact. Neurovascularly intact   Imaging: stable post op imaging  Labs:  Results for orders placed or performed during the hospital encounter of 05/28/18 (from the past 24 hour(s))  CBC     Status: Abnormal   Collection Time: 05/29/18  1:49 AM  Result Value Ref Range   WBC 6.5 4.0 - 10.5 K/uL   RBC 2.45 (L) 3.87 - 5.11 MIL/uL   Hemoglobin 8.2 (L) 12.0 - 15.0 g/dL   HCT 25.0 (L) 36.0 - 46.0 %   MCV 102.0 (H) 80.0 - 100.0 fL   MCH 33.5 26.0 - 34.0 pg   MCHC 32.8 30.0 - 36.0 g/dL   RDW 15.7 (H) 11.5 - 15.5 %   Platelets 170 150 - 400 K/uL   nRBC 0.0 0.0 - 0.2 %    Assessment: 85 year old female with painful orthopaedic hardware  Injuries: Left femoral shaft fracture s/p intermedullary nailing on 04/07/18, removal of one screw on 05/28/18  Weightbearing: WBAT LLE   Insicional and dressing care: Dressing can be changed PRN  Orthopedic device(s): none  CV/Blood loss: Hgb 8.2 this AM  Hemodynamically stable.  Pain management: 1. Tylenol 650  mg q 6 hours scheduled 2. Tramadol 50-100 mg q 6 hours PRN 3. Morphine 2 mg q 2 hours PRN   VTE prophylaxis: Okay to restart Plavix today  ID: Ancef 2gm post op completed  Foley/Lines: No foley, KVO IVFs  Medical co-morbidities: hypertension, hyperlipidemia, history ofstroke, CKD stage III, and ascending thoracic aortic aneurysm   Dispo: PT eval today, will likely be discharged home this afternoon  Follow - up plan: 2 weeks   Dadrian Ballantine A. Carmie Kanner [facility] ?[phone]? (phone)

## 2018-05-29 NOTE — Evaluation (Signed)
Physical Therapy Evaluation Patient Details Name: Laura Cabrera MRN: [id number] DOB: 05-15-1925 Today's Date: 05/29/2018   History of Present Illness  Patient is a 85 y/o female who presents from home s/p fall. s/p ORIF LLE 3/25 and now hardware removal with femoral head cutoff to left femur 4/15. PMH includes HTN, HLD, CVA, CKD, thoracic aortic aneurysm  Clinical Impression  Patient presents with pain and post surgical deficits s/p above surgery. Pt not a great historian but reports getting assist with ADLs and walking with RW PTA. Today, tolerated transfers and gait training with Mod-Min A for balance/safety. Pt incontinent of urine upon standing. Instructed pt in exercises to perform in the chair daily. Per ortho PA, pt has supportive family who plans to provide assist at d/c. If pt does not have BSC, will need one. Recommnend 24/7 assist/supervision for OOB mobility. Will follow acutely to maximize independence and mobility prior to return home.     Follow Up Recommendations Home health PT;Supervision for mobility/OOB;Supervision/Assistance - 24 hour    Equipment Recommendations  None recommended by PT    Recommendations for Other Services       Precautions / Restrictions Precautions Precautions: Fall Precaution Comments: repeated falls Restrictions Weight Bearing Restrictions: Yes LLE Weight Bearing: Weight bearing as tolerated      Mobility  Bed Mobility Overal bed mobility: Needs Assistance Bed Mobility: Supine to Sit     Supine to sit: HOB elevated;Min guard     General bed mobility comments: Increased time to get to EOB wtih cues for technique, pushes through BUEs to elevate trunk to get to EOB.   Transfers Overall transfer level: Needs assistance Equipment used: Rolling walker (2 wheeled) Transfers: Sit to/from Stand Sit to Stand: Mod assist;+2 physical assistance         General transfer comment: Mod A +2 to power to standing wtih cues for hand placement.  Transferred to chair post ambulation.  Ambulation/Gait Ambulation/Gait assistance: Min guard Gait Distance (Feet): 17 Feet Assistive device: Rolling walker (2 wheeled) Gait Pattern/deviations: Step-through pattern;Decreased stance time - left;Decreased step length - right;Trunk flexed;Antalgic Gait velocity: decreased Gait velocity interpretation: <1.31 ft/sec, indicative of household ambulator General Gait Details: Slow, mildly unsteady gait with increased WB through BUEs. Antalgic like gait but able to perform step through. Fatigues.   Stairs            Wheelchair Mobility    Modified Rankin (Stroke Patients Only)       Balance Overall balance assessment: Needs assistance Sitting-balance support: Bilateral upper extremity supported;Feet supported Sitting balance-Leahy Scale: Fair     Standing balance support: During functional activity;Bilateral upper extremity supported Standing balance-Leahy Scale: Poor Standing balance comment: Reliant of UE support of RW.                             Pertinent Vitals/Pain Pain Assessment: Faces Faces Pain Scale: Hurts a little bit Pain Location: LLE Pain Descriptors / Indicators: Sore;Operative site guarding Pain Intervention(s): Monitored during session;Repositioned    Home Living Family/patient expects to be discharged to:: Private residence Living Arrangements: Children(with daughter) Available Help at Discharge: Family;Available 24 hours/day Type of Home: House Home Access: Stairs to enter   [employer] of Steps: 3 Home Layout: One level Home Equipment: Walker - 2 wheels;Cane - quad;Shower seat;Adaptive equipment      Prior Function Level of Independence: Needs assistance   Gait / Transfers Assistance Needed: USes RW for ambulation with  assist  ADL's / Homemaking Assistance Needed: Some assist for ADLs per report  Comments: Pt not a great historian but per ortho PA, pt has support of  daughters at home and plan on taking her home.     Hand Dominance        Extremity/Trunk Assessment   Upper Extremity Assessment Upper Extremity Assessment: Defer to OT evaluation    Lower Extremity Assessment Lower Extremity Assessment: LLE deficits/detail LLE Deficits / Details: Grossly ~3/5 throughout, limited with hip flexion due to pain.     Cervical / Trunk Assessment Cervical / Trunk Assessment: Kyphotic  Communication   Communication: HOH  Cognition Arousal/Alertness: Awake/alert Behavior During Therapy: WFL for tasks assessed/performed Overall Cognitive Status: History of cognitive impairments - at baseline                                 General Comments: A&Ox2, does not know the year "2002" or what hospital she is at. Tangential in speech. HOH, requires repetition to answer questions/follow commands.       General Comments General comments (skin integrity, edema, etc.): Bruising and ecchymosis present LLE- reports improved per pt.     Exercises General Exercises - Lower Extremity Ankle Circles/Pumps: Both;10 reps;AROM;Seated Quad Sets: Strengthening;Both;10 reps;Seated   Assessment/Plan    PT Assessment Patient needs continued PT services  PT Problem List Decreased strength;Decreased range of motion;Decreased activity tolerance;Decreased balance;Decreased mobility;Decreased knowledge of use of DME;Decreased cognition;Decreased safety awareness;Pain;Decreased skin integrity       PT Treatment Interventions DME instruction;Gait training;Stair training;Functional mobility training;Therapeutic activities;Therapeutic exercise;Balance training;Neuromuscular re-education;Cognitive remediation;Patient/family education;Manual techniques;Modalities    PT Goals (Current goals can be found in the Care Plan section)  Acute Rehab PT Goals Patient Stated Goal: go home PT Goal Formulation: With patient Time For Goal Achievement: 06/12/18 Potential to  Achieve Goals: Good    Frequency Min 5X/week   Barriers to discharge Inaccessible home environment stairs??    Co-evaluation               AM-PAC PT "6 Clicks" Mobility  Outcome Measure Help needed turning from your back to your side while in a flat bed without using bedrails?: A Little Help needed moving from lying on your back to sitting on the side of a flat bed without using bedrails?: A Little Help needed moving to and from a bed to a chair (including a wheelchair)?: A Lot Help needed standing up from a chair using your arms (e.g., wheelchair or bedside chair)?: A Lot Help needed to walk in hospital room?: A Little Help needed climbing 3-5 steps with a railing? : A Lot 6 Click Score: 15    End of Session Equipment Utilized During Treatment: Gait belt Activity Tolerance: Patient tolerated treatment well Patient left: in chair;with call bell/phone within reach;with chair alarm set Nurse Communication: Mobility status PT Visit Diagnosis: Unsteadiness on feet (R26.81);Other abnormalities of gait and mobility (R26.89);Repeated falls (R29.6);Muscle weakness (generalized) (M62.81);History of falling (Z91.81);Difficulty in walking, not elsewhere classified (R26.2);Pain Pain - Right/Left: Left Pain - part of body: Hip;Leg    Time: 5643-3295 PT Time Calculation (min) (ACUTE ONLY): 24 min   Charges:   PT Evaluation $PT Eval Moderate Complexity: 1 Mod PT Treatments $Gait Training: 8-22 mins        Wray Kearns, PT, DPT Acute Rehabilitation Services Pager [phone] Office [phone]      [place] 05/29/2018, 8:33 AM

## 2018-05-29 NOTE — Progress Notes (Signed)
AVS printed and placed in patient discharge packet. Pt's daughter, Jeani Hawking, aware of discharge paperwork. Pt to be escorted off the unit via wheelchair by volunteer services.

## 2018-05-29 NOTE — TOC Transition Note (Signed)
Transition of Care [facility]) - CM/SW Discharge Note   Patient Details  Name: Laura Cabrera MRN: [id number] Date of Birth: 03/20/1925  [facility] CM/SW Contact:  Marilu Favre, RN Phone Number: 05/29/2018, 10:25 AM   Clinical Narrative:     Spoke to daughter Lyn. Patient from home Lyn and her sister take turns staying with her mother 24/7.   Patient is active with [facility] and Arabi wants to continue with [facility]. Spoke with Dian Situ with [facility] aware patient is discharging to home today and patient is observation therefore no new orders needed.  Patent has wheelchair, bedside commode, shower chair, walker and walk in shower at home .   Lyn requesting bedside nurse to her to discuss time of discharge. Family will transport home. PTAR offered Lyn declined   Final next level of care: Home w Home Health Services Barriers to Discharge: No Barriers Identified   Patient Goals and CMS Choice Patient states their goals for this hospitalization and ongoing recovery are:: to go home  CMS Medicare.gov Compare Post Acute Care list provided to:: Patient Represenative (must comment)(Daughter Jeani Hawking) Choice offered to / list presented to : Adult Children  Discharge Placement                       Discharge Plan and Services   Discharge Planning Services: CM Consult Post Acute Care Choice: Home Health          DME Arranged: N/A DME Agency: NA HH Arranged: PT [place] Agency: [facility]   Social Determinants of Health (SDOH) Interventions     Readmission Risk Interventions No flowsheet data found.

## 2018-05-29 NOTE — Progress Notes (Signed)
This RN spoke to pt's daughter, Jeani Hawking, regarding discharge plan. Jeani Hawking aware and agreeable pt to be discharged home today. Will continue to monitor.

## 2018-05-29 NOTE — Discharge Summary (Signed)
Orthopaedic Trauma Service (OTS) Discharge Summary   Patient ID: Laura Cabrera MRN: [id number] DOB/AGE: 83-Sep-1927 83 y.o.  Admit date: 05/28/2018 Discharge date: 05/29/2018  Admission Diagnoses: Painful orthopaedic hardware  Discharge Diagnoses:  Active Problems:   Painful orthopaedic hardware [facility])   Past Medical History:  Diagnosis Date  . Anemia   . Aortic aneurysm [facility])    per daughter Zigmund Daniel  . Cancer [facility])    right breast cancer  . Femur fracture (HCC)    left  . GERD (gastroesophageal reflux disease)   . Glaucoma   . Headache    migraine  . HOH (hard of hearing)   . Hypertension   . Hypothyroidism    PMH  . Pelvic fracture ([place])   . Pneumonia   . Raynaud disease   . Stroke ([place])   . TIA (transient ischemic attack)   . Wears glasses   . Wears hearing aid    B/L  . Wears partial dentures      Procedures Performed: Removal orthopaedic hardware  Discharged Condition: good  Hospital Course: Patient presented to [facility] on 05/28/2018 for removal of painful orthopaedic hardware. Patient previously had IM nailing of left femur on 05/06/18. She tolerated the procedure well but over the past several weeks one of the promximal screws medialized into the hip joint, causing the patient significant pain. Patient was taken to the operating room by Dr. Doreatha Martin on 05/28/18 for removal of this painful screw. She tolerated the procedure well. Was admitted overnight for observation and pain control. Worked with therapy on POD #1.  On 05/29/2018, the patient was tolerating diet, working well with therapies, pain well controlled, vital signs stable, dressings clean, dry, intact and felt stable for discharge to home. Patient will follow up as below and knows to call with questions or concerns.     Consults: None  Significant Diagnostic Studies: None  Treatments: surgery: Removal painful orthopaedic hardware left femur  Discharge Exam: General -Sitting up in  bed comfortably, NAD Cardiac - heart regular rate and rhtyhm Lungs - No increased work of breathing. CTA anterior lung fields bilaterally LLE - Dressing clean, dry, intact. Minimal swelling of thigh or lower leg. Minimal tenderness to palpation of thigh, hip, knee, or lower leg. Knee ROM without significant discomfort. Full ankle ROM. Sensory and motor function intact. Neurovascularly intact  Disposition: Home with home health   Allergies as of 05/29/2018      Reactions   Sulfamethoxazole Other (See Comments)   UNSPECIFIED CHILDHOOD REACTION    Penicillin G Rash   Did it involve swelling of the face/tongue/throat, SOB, or low BP? No Did it involve sudden or severe rash/hives, skin peeling, or any reaction on the inside of your mouth or nose? No Did you need to seek medical attention at a hospital or doctor's office? No When did it last happen?Adolescent allergy If all above answers are "NO", may proceed with cephalosporin use.      Medication List    STOP taking these medications   oxyCODONE 5 MG immediate release tablet Commonly known as:  Oxy IR/ROXICODONE     TAKE these medications   acetaminophen 500 MG tablet Commonly known as:  TYLENOL Take 1 tablet (500 mg total) by mouth every 12 (twelve) hours.   bisacodyl 5 MG EC tablet Commonly known as:  DULCOLAX Take 5 mg by mouth daily.   clopidogrel 75 MG tablet Commonly known as:  PLAVIX Take 75 mg by mouth daily.  ferrous sulfate 325 (65 FE) MG tablet Take 325 mg by mouth daily with breakfast.   latanoprost 0.005 % ophthalmic solution Commonly known as:  XALATAN Place 1 drop into both eyes at bedtime.   mirabegron ER 25 MG Tb24 tablet Commonly known as:  MYRBETRIQ Take 25 mg by mouth at bedtime.   propranolol 40 MG tablet Commonly known as:  INDERAL Take 40 mg by mouth daily.   traMADol 50 MG tablet Commonly known as:  ULTRAM Take 1 tablet (50 mg total) by mouth every 6 (six) hours as needed for moderate  pain.      Follow-up Information    Haddix, Thomasene Lot, MD. Go in 2 week(s).   Specialty:  Orthopedic Surgery Why:  repeat x-rays Contact information: [address] [place] [address] [phone]           Discharge Instructions and Plan: Patient will be discharged home with her daughters and will continue with ehr home health PT. She will be weightbearing as tolerated to the left lower extremity. She will continue her home dose of Plavix for DVT prophylaxis. She will follow up with Dr. Doreatha Martin in 2 weeks for repat x-rays of the left femur.   Signed:  Leary Roca. Carmie Kanner ?[phone]? (phone) 05/29/2018, 8:14 AM  [facility] [place] [address] [phone] (F)

## 2018-05-29 NOTE — Plan of Care (Signed)
  Problem: Education: Goal: Knowledge of General Education information will improve Description: Including pain rating scale, medication(s)/side effects and non-pharmacologic comfort measures Outcome: Progressing   Problem: Health Behavior/Discharge Planning: Goal: Ability to manage health-related needs will improve Outcome: Progressing   Problem: Clinical Measurements: Goal: Respiratory complications will improve Outcome: Progressing Goal: Cardiovascular complication will be avoided Outcome: Progressing   Problem: Activity: Goal: Risk for activity intolerance will decrease Outcome: Progressing   Problem: Nutrition: Goal: Adequate nutrition will be maintained Outcome: Progressing   Problem: Coping: Goal: Level of anxiety will decrease Outcome: Progressing   Problem: Elimination: Goal: Will not experience complications related to urinary retention Outcome: Progressing   Problem: Pain Managment: Goal: General experience of comfort will improve Outcome: Progressing   Problem: Safety: Goal: Ability to remain free from injury will improve Outcome: Progressing   Problem: Skin Integrity: Goal: Risk for impaired skin integrity will decrease Outcome: Progressing   

## 2019-11-05 IMAGING — DX LEFT FEMUR PORTABLE 2 VIEWS
3 series · 3 of 3 positions shown · non-contrast
Comparison: Radiographs May 13, 2018.

CLINICAL DATA: Status post left hip surgery.

EXAM:
LEFT FEMUR PORTABLE 2 VIEWS

[pelvis ap]
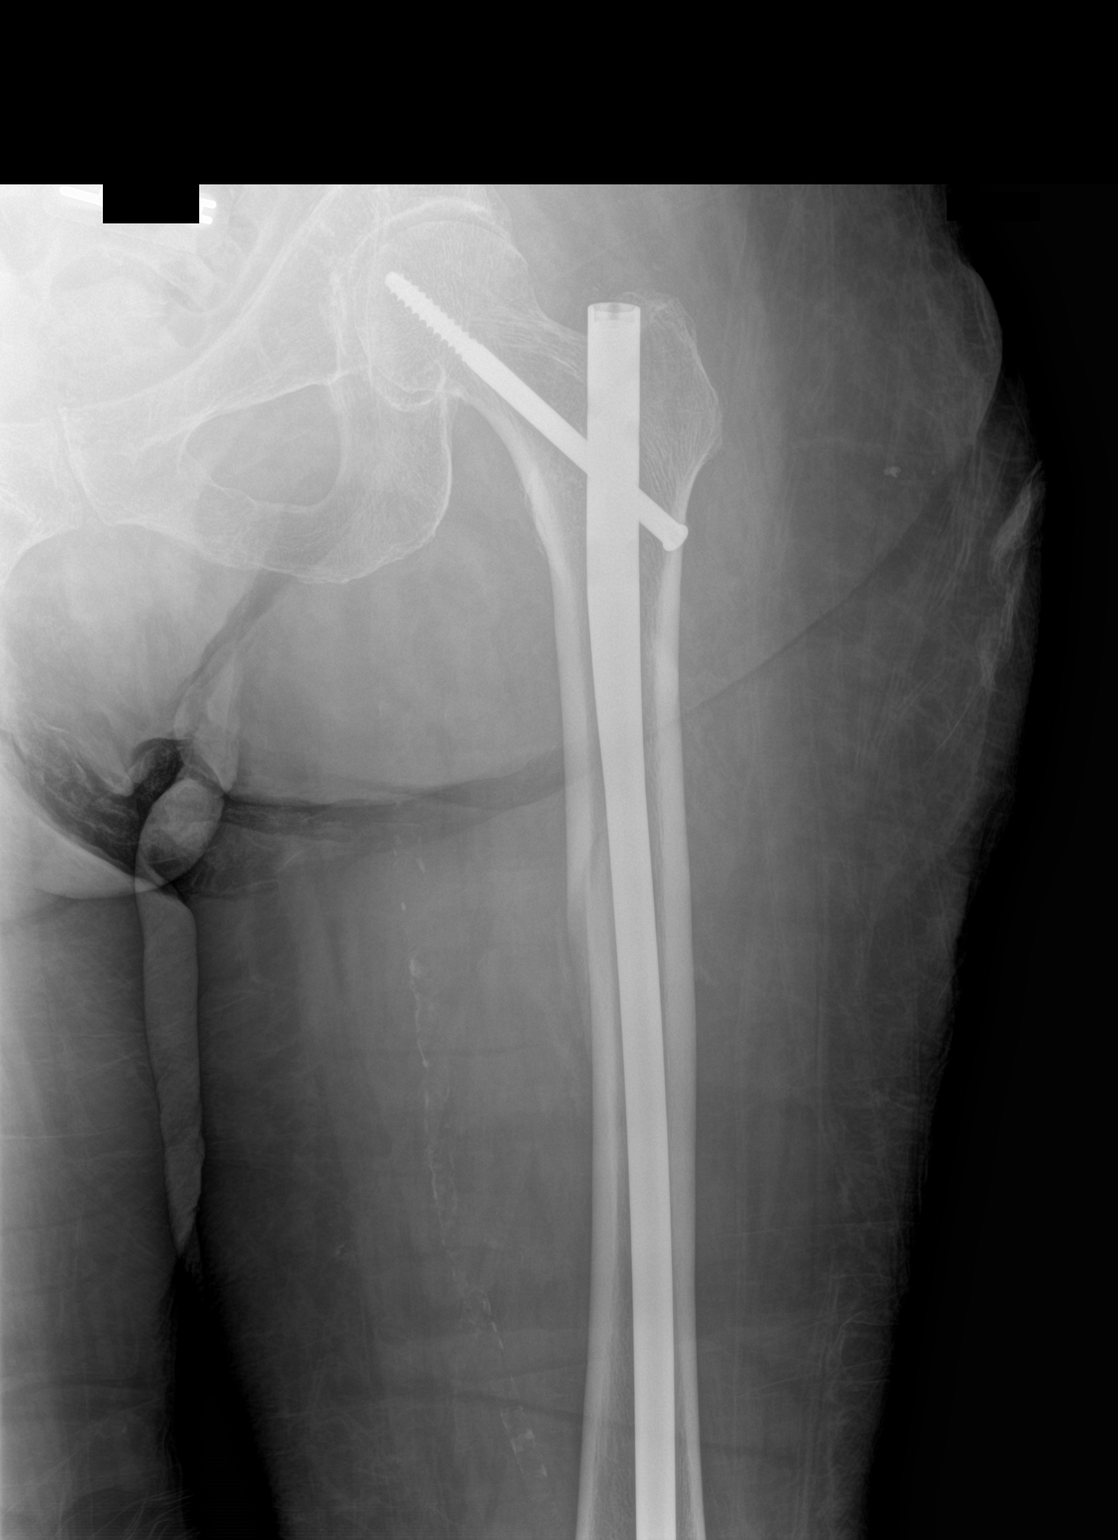

[hip ap]
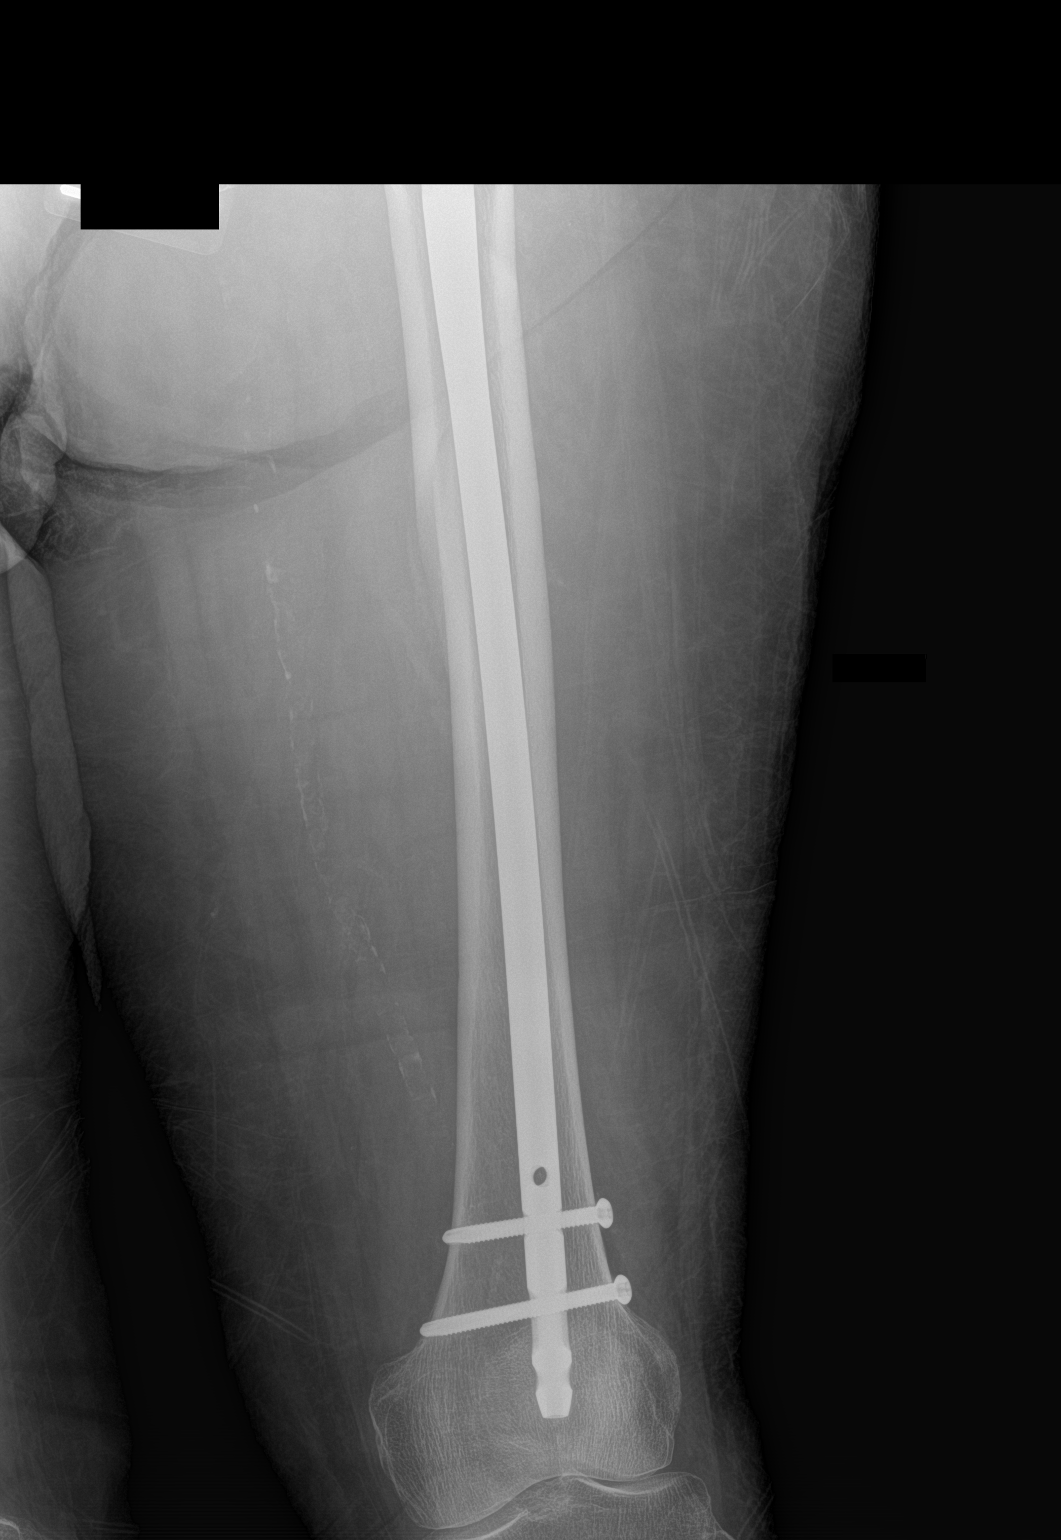

[femur lat]
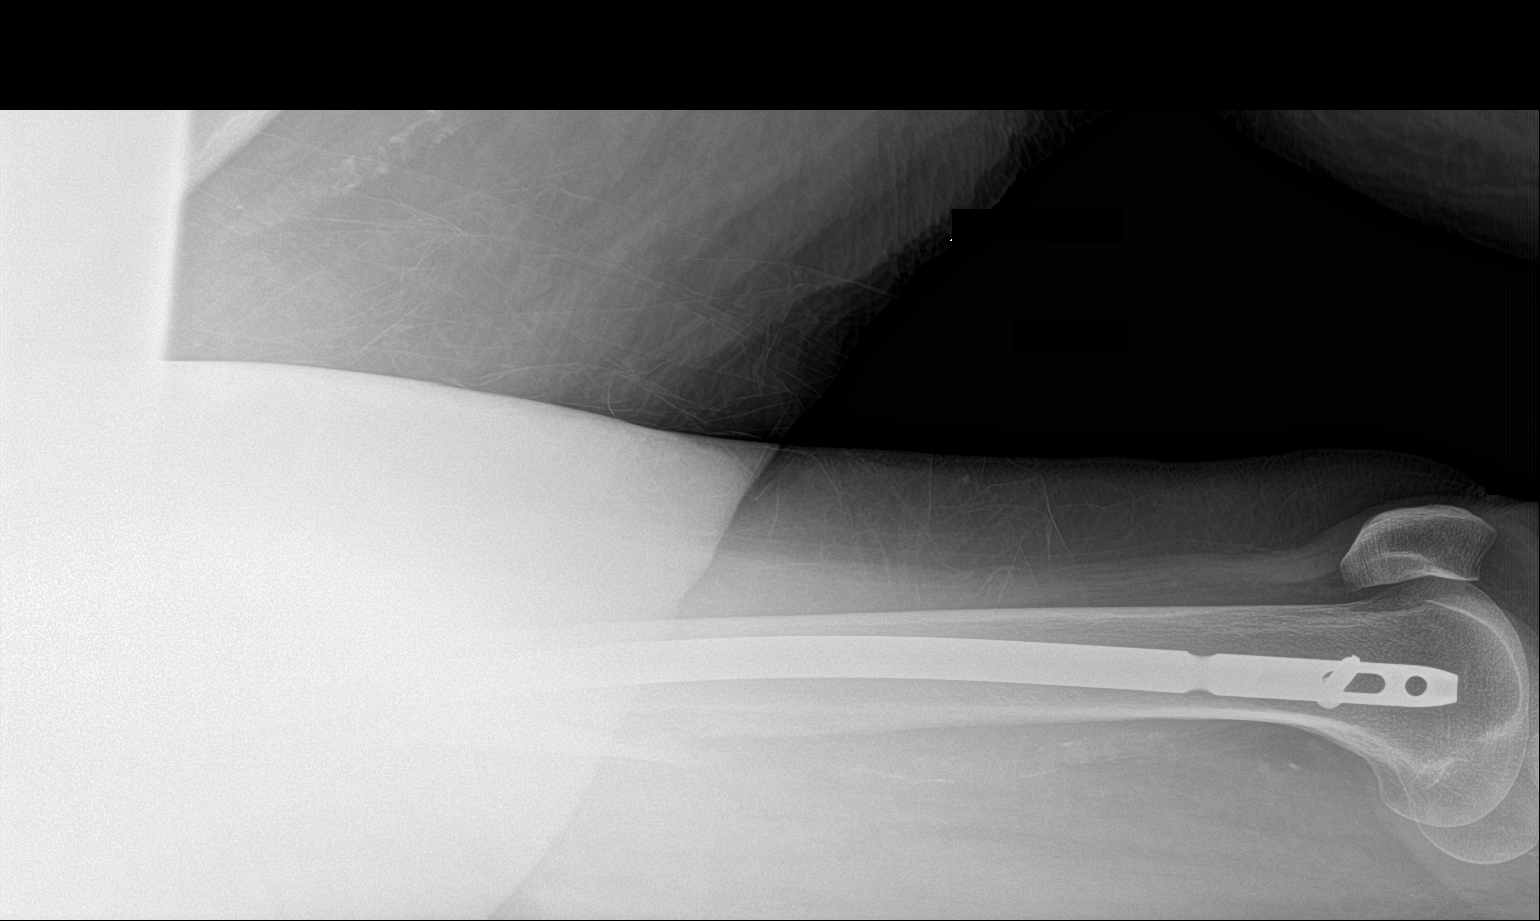

[3 of 3 positions shown; findings below may reference images not displayed]

FINDINGS: There is again noted intramedullary rod fixation of the left femur
for treatment of proximal left femoral shaft fracture. Vascular
calcifications are noted. One of 2 surgical pins seen in left
femoral head and neck on prior exam has been removed in the
interval. No new fracture or dislocation is noted.
IMPRESSION: Intramedullary rod fixation of proximal left femoral shaft fracture
is again noted. There has been interval removal of 1 surgical pin
from the left femoral head and neck.

## 2020-11-11 DEATH — deceased
